# Patient Record
Sex: Female | Born: 1960 | Race: White | Hispanic: No | Marital: Married | Smoking: Never smoker
Health system: Southern US, Community
[De-identification: ages and names within clinical notes are randomized; demographics above are authoritative.]

## PROBLEM LIST (undated history)

## (undated) DIAGNOSIS — F39 Unspecified mood [affective] disorder: Secondary | ICD-10-CM

## (undated) DIAGNOSIS — N200 Calculus of kidney: Secondary | ICD-10-CM

## (undated) DIAGNOSIS — Z86010 Personal history of colonic polyps: Secondary | ICD-10-CM

## (undated) DIAGNOSIS — K900 Celiac disease: Secondary | ICD-10-CM

## (undated) DIAGNOSIS — R87619 Unspecified abnormal cytological findings in specimens from cervix uteri: Secondary | ICD-10-CM

## (undated) DIAGNOSIS — T7840XA Allergy, unspecified, initial encounter: Secondary | ICD-10-CM

## (undated) DIAGNOSIS — D219 Benign neoplasm of connective and other soft tissue, unspecified: Secondary | ICD-10-CM

## (undated) DIAGNOSIS — R32 Unspecified urinary incontinence: Secondary | ICD-10-CM

## (undated) DIAGNOSIS — F329 Major depressive disorder, single episode, unspecified: Secondary | ICD-10-CM

## (undated) DIAGNOSIS — E785 Hyperlipidemia, unspecified: Secondary | ICD-10-CM

## (undated) DIAGNOSIS — F419 Anxiety disorder, unspecified: Secondary | ICD-10-CM

## (undated) DIAGNOSIS — N809 Endometriosis, unspecified: Secondary | ICD-10-CM

## (undated) DIAGNOSIS — M549 Dorsalgia, unspecified: Secondary | ICD-10-CM

## (undated) DIAGNOSIS — B159 Hepatitis A without hepatic coma: Secondary | ICD-10-CM

## (undated) DIAGNOSIS — F32A Depression, unspecified: Secondary | ICD-10-CM

## (undated) DIAGNOSIS — N979 Female infertility, unspecified: Secondary | ICD-10-CM

## (undated) DIAGNOSIS — G44009 Cluster headache syndrome, unspecified, not intractable: Secondary | ICD-10-CM

## (undated) HISTORY — PX: DILATION AND CURETTAGE OF UTERUS: SHX78

## (undated) HISTORY — DX: Hyperlipidemia, unspecified: E78.5

## (undated) HISTORY — DX: Calculus of kidney: N20.0

## (undated) HISTORY — DX: Female infertility, unspecified: N97.9

## (undated) HISTORY — PX: OTHER SURGICAL HISTORY: SHX169

## (undated) HISTORY — DX: Unspecified urinary incontinence: R32

## (undated) HISTORY — DX: Unspecified abnormal cytological findings in specimens from cervix uteri: R87.619

## (undated) HISTORY — DX: Cluster headache syndrome, unspecified, not intractable: G44.009

## (undated) HISTORY — PX: COLONOSCOPY: SHX174

## (undated) HISTORY — DX: Dorsalgia, unspecified: M54.9

## (undated) HISTORY — DX: Endometriosis, unspecified: N80.9

## (undated) HISTORY — DX: Anxiety disorder, unspecified: F41.9

## (undated) HISTORY — DX: Personal history of colonic polyps: Z86.010

## (undated) HISTORY — DX: Major depressive disorder, single episode, unspecified: F32.9

## (undated) HISTORY — DX: Allergy, unspecified, initial encounter: T78.40XA

## (undated) HISTORY — DX: Benign neoplasm of connective and other soft tissue, unspecified: D21.9

## (undated) HISTORY — DX: Depression, unspecified: F32.A

## (undated) HISTORY — PX: EXPLORATORY LAPAROTOMY: SUR591

## (undated) HISTORY — DX: Unspecified mood (affective) disorder: F39

## (undated) HISTORY — DX: Celiac disease: K90.0

## (undated) HISTORY — DX: Hepatitis a without hepatic coma: B15.9

---

## 2009-08-22 ENCOUNTER — Ambulatory Visit: Payer: Self-pay | Admitting: Internal Medicine

## 2009-11-22 ENCOUNTER — Ambulatory Visit: Payer: Self-pay | Admitting: Internal Medicine

## 2009-11-30 ENCOUNTER — Encounter: Admission: RE | Admit: 2009-11-30 | Discharge: 2009-11-30 | Payer: Self-pay | Admitting: Internal Medicine

## 2009-12-02 ENCOUNTER — Ambulatory Visit (HOSPITAL_BASED_OUTPATIENT_CLINIC_OR_DEPARTMENT_OTHER): Admission: RE | Admit: 2009-12-02 | Discharge: 2009-12-02 | Payer: Self-pay | Admitting: Obstetrics & Gynecology

## 2010-09-12 ENCOUNTER — Ambulatory Visit
Admission: RE | Admit: 2010-09-12 | Discharge: 2010-09-12 | Payer: Self-pay | Source: Home / Self Care | Attending: Internal Medicine | Admitting: Internal Medicine

## 2010-09-12 ENCOUNTER — Other Ambulatory Visit
Admission: RE | Admit: 2010-09-12 | Discharge: 2010-09-12 | Payer: Self-pay | Source: Home / Self Care | Admitting: Internal Medicine

## 2010-11-22 LAB — CBC
HCT: 38.1 % (ref 36.0–46.0)
Hemoglobin: 12.6 g/dL (ref 12.0–15.0)
Platelets: 295 10*3/uL (ref 150–400)
RBC: 4.27 MIL/uL (ref 3.87–5.11)
RDW: 13.6 % (ref 11.5–15.5)

## 2010-11-22 LAB — URINALYSIS, ROUTINE W REFLEX MICROSCOPIC
Bilirubin Urine: NEGATIVE
Hgb urine dipstick: NEGATIVE
Ketones, ur: NEGATIVE mg/dL
Protein, ur: NEGATIVE mg/dL
Specific Gravity, Urine: 1.013 (ref 1.005–1.030)
Urobilinogen, UA: 0.2 mg/dL (ref 0.0–1.0)

## 2010-11-22 LAB — BASIC METABOLIC PANEL
BUN: 10 mg/dL (ref 6–23)
Calcium: 9 mg/dL (ref 8.4–10.5)
Creatinine, Ser: 0.66 mg/dL (ref 0.4–1.2)
GFR calc Af Amer: >60 mL/min (ref >60)

## 2010-12-11 ENCOUNTER — Other Ambulatory Visit: Payer: Self-pay | Admitting: Internal Medicine

## 2010-12-12 ENCOUNTER — Ambulatory Visit (INDEPENDENT_AMBULATORY_CARE_PROVIDER_SITE_OTHER): Payer: BC Managed Care – PPO | Admitting: Internal Medicine

## 2010-12-12 DIAGNOSIS — E785 Hyperlipidemia, unspecified: Secondary | ICD-10-CM

## 2010-12-12 DIAGNOSIS — K900 Celiac disease: Secondary | ICD-10-CM

## 2010-12-12 DIAGNOSIS — G5600 Carpal tunnel syndrome, unspecified upper limb: Secondary | ICD-10-CM

## 2011-01-01 DIAGNOSIS — Z860101 Personal history of adenomatous and serrated colon polyps: Secondary | ICD-10-CM

## 2011-01-01 DIAGNOSIS — Z86010 Personal history of colonic polyps: Secondary | ICD-10-CM | POA: Insufficient documentation

## 2011-01-01 HISTORY — DX: Personal history of adenomatous and serrated colon polyps: Z86.0101

## 2011-01-01 HISTORY — DX: Personal history of colonic polyps: Z86.010

## 2011-01-08 ENCOUNTER — Other Ambulatory Visit: Payer: Self-pay | Admitting: Internal Medicine

## 2011-01-08 DIAGNOSIS — Z1231 Encounter for screening mammogram for malignant neoplasm of breast: Secondary | ICD-10-CM

## 2011-01-16 ENCOUNTER — Ambulatory Visit
Admission: RE | Admit: 2011-01-16 | Discharge: 2011-01-16 | Disposition: A | Discharge status: Home / Self Care | Payer: BC Managed Care – PPO | Source: Ambulatory Visit | Attending: Internal Medicine | Admitting: Internal Medicine

## 2011-01-16 DIAGNOSIS — Z1231 Encounter for screening mammogram for malignant neoplasm of breast: Secondary | ICD-10-CM

## 2011-02-12 ENCOUNTER — Encounter: Payer: Self-pay | Admitting: Internal Medicine

## 2011-02-12 ENCOUNTER — Ambulatory Visit (INDEPENDENT_AMBULATORY_CARE_PROVIDER_SITE_OTHER): Payer: BC Managed Care – PPO | Admitting: Internal Medicine

## 2011-02-12 VITALS — BP 142/90 | HR 72 | Temp 98.7°F | Ht 65.0 in | Wt 156.0 lb

## 2011-02-12 DIAGNOSIS — F39 Unspecified mood [affective] disorder: Secondary | ICD-10-CM

## 2011-02-12 DIAGNOSIS — K900 Celiac disease: Secondary | ICD-10-CM

## 2011-02-12 DIAGNOSIS — IMO0001 Reserved for inherently not codable concepts without codable children: Secondary | ICD-10-CM

## 2011-02-12 DIAGNOSIS — M5430 Sciatica, unspecified side: Secondary | ICD-10-CM

## 2011-02-12 DIAGNOSIS — E559 Vitamin D deficiency, unspecified: Secondary | ICD-10-CM

## 2011-02-12 DIAGNOSIS — E785 Hyperlipidemia, unspecified: Secondary | ICD-10-CM

## 2011-02-12 DIAGNOSIS — M7918 Myalgia, other site: Secondary | ICD-10-CM

## 2011-02-12 DIAGNOSIS — M549 Dorsalgia, unspecified: Secondary | ICD-10-CM

## 2011-02-12 DIAGNOSIS — J309 Allergic rhinitis, unspecified: Secondary | ICD-10-CM

## 2011-02-13 ENCOUNTER — Encounter: Payer: Self-pay | Admitting: Internal Medicine

## 2011-02-13 DIAGNOSIS — F39 Unspecified mood [affective] disorder: Secondary | ICD-10-CM | POA: Insufficient documentation

## 2011-02-13 DIAGNOSIS — E785 Hyperlipidemia, unspecified: Secondary | ICD-10-CM | POA: Insufficient documentation

## 2011-02-13 DIAGNOSIS — M549 Dorsalgia, unspecified: Secondary | ICD-10-CM | POA: Insufficient documentation

## 2011-02-13 DIAGNOSIS — J309 Allergic rhinitis, unspecified: Secondary | ICD-10-CM | POA: Insufficient documentation

## 2011-02-13 NOTE — Progress Notes (Signed)
  Subjective:    Patient ID: Sheryl Nguyen, female    DOB: 1961-07-23, 50 y.o.   MRN: [id number]  HPI patient struggling for several weeks pain in the buttock radiating down posterior leg. Says the pain doesn't feel like a strained muscle but has a quite" different "kind of feeling. No weakness in the lower extremities but tiresome to walk. Trouble sitting in court. She is an Pensions consultant. Also some shoulder stiffness and neck soreness.    Review of Systems     Objective:   Physical Exam straight leg raising negative bilaterally,  deep tendon reflexes 2+ and symmetrical in the knees 1+ and symmetrical in the ankles; muscle strength 5/5 in both lower extremities. Tender in right parascapular area and bilateral sternocleidomastoid area.        Assessment & Plan:  Musculoskeletal pain neck and shoulder  Sciatica  Plan Sterapred DS 10 mg 6 day Dosepak. Flexeril 10 mg #30 one half to one by mouth each bedtime with when necessary 1 year refill. Vicodin 5/500 #60 one by mouth every 6 hours when necessary pain. If not improved in a week consider physical therapy.

## 2011-02-13 NOTE — Patient Instructions (Addendum)
   Take meds as directed. Call if no better in 2 weeks.

## 2011-05-17 ENCOUNTER — Other Ambulatory Visit: Payer: Self-pay | Admitting: Internal Medicine

## 2011-06-25 ENCOUNTER — Other Ambulatory Visit: Payer: Self-pay | Admitting: Internal Medicine

## 2011-07-24 ENCOUNTER — Other Ambulatory Visit: Payer: Self-pay | Admitting: Internal Medicine

## 2011-09-06 ENCOUNTER — Other Ambulatory Visit: Payer: Self-pay | Admitting: Internal Medicine

## 2011-09-10 ENCOUNTER — Other Ambulatory Visit: Payer: BC Managed Care – PPO | Admitting: Internal Medicine

## 2011-09-11 ENCOUNTER — Encounter: Payer: BC Managed Care – PPO | Admitting: Internal Medicine

## 2011-09-21 ENCOUNTER — Other Ambulatory Visit: Payer: BC Managed Care – PPO | Admitting: Internal Medicine

## 2011-09-24 ENCOUNTER — Ambulatory Visit: Payer: BC Managed Care – PPO | Admitting: Internal Medicine

## 2011-10-08 ENCOUNTER — Other Ambulatory Visit: Payer: Self-pay | Admitting: Internal Medicine

## 2011-10-11 ENCOUNTER — Other Ambulatory Visit: Payer: Self-pay | Admitting: Internal Medicine

## 2011-10-11 ENCOUNTER — Other Ambulatory Visit: Payer: BC Managed Care – PPO | Admitting: Internal Medicine

## 2011-10-11 DIAGNOSIS — Z0000 Encounter for general adult medical examination without abnormal findings: Secondary | ICD-10-CM

## 2011-10-11 LAB — COMPREHENSIVE METABOLIC PANEL
Alkaline Phosphatase: 67 U/L (ref 39–117)
BUN: 15 mg/dL (ref 6–23)
Calcium: 9.3 mg/dL (ref 8.4–10.5)
Glucose, Bld: 78 mg/dL (ref 70–99)
Potassium: 4 mEq/L (ref 3.5–5.3)
Total Bilirubin: 0.7 mg/dL (ref 0.3–1.2)

## 2011-10-11 LAB — TSH: TSH: 1.287 u[IU]/mL (ref 0.350–4.500)

## 2011-10-11 LAB — CBC WITH DIFFERENTIAL/PLATELET
Basophils Absolute: 0 10*3/uL (ref 0.0–0.1)
Basophils Relative: 1 % (ref 0–1)
Eosinophils Relative: 4 % (ref 0–5)
Lymphs Abs: 2.3 10*3/uL (ref 0.7–4.0)
MCV: 90.9 fL (ref 78.0–100.0)
Monocytes Relative: 7 % (ref 3–12)
Neutrophils Relative %: 56 % (ref 43–77)
RBC: 4.49 MIL/uL (ref 3.87–5.11)
RDW: 13 % (ref 11.5–15.5)
WBC: 7.1 10*3/uL (ref 4.0–10.5)

## 2011-10-11 LAB — LIPID PANEL
HDL: 48 mg/dL (ref >39)
LDL Cholesterol: 83 mg/dL (ref 0–99)
Triglycerides: 125 mg/dL (ref <150)

## 2011-10-12 LAB — VITAMIN D 25 HYDROXY (VIT D DEFICIENCY, FRACTURES): Vit D, 25-Hydroxy: 35 ng/mL (ref 30–89)

## 2011-10-15 ENCOUNTER — Ambulatory Visit (INDEPENDENT_AMBULATORY_CARE_PROVIDER_SITE_OTHER): Payer: BC Managed Care – PPO | Admitting: Internal Medicine

## 2011-10-15 ENCOUNTER — Encounter: Payer: Self-pay | Admitting: Internal Medicine

## 2011-10-15 VITALS — BP 126/90 | HR 68 | Temp 98.5°F | Ht 62.0 in | Wt 157.0 lb

## 2011-10-15 DIAGNOSIS — Z0000 Encounter for general adult medical examination without abnormal findings: Secondary | ICD-10-CM

## 2011-10-15 LAB — POCT URINALYSIS DIPSTICK
Glucose, UA: NEGATIVE
Ketones, UA: NEGATIVE
Leukocytes, UA: NEGATIVE
Protein, UA: NEGATIVE
pH, UA: 5.5

## 2011-10-15 LAB — CK: Total CK: 76 U/L (ref 7–177)

## 2011-11-03 ENCOUNTER — Encounter: Payer: Self-pay | Admitting: Internal Medicine

## 2011-11-03 NOTE — Patient Instructions (Signed)
Continue current medications. Return in 6 months for fasting lipid panel liver functions and office visit.

## 2011-11-03 NOTE — Progress Notes (Signed)
Subjective:    Patient ID: Sheryl Nguyen, female    DOB: [birth year], 51 y.o.   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 2010. 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 She has a history of allergic rhinitis and some mood disorder and depression. She had kidney stone some time around 2008 but we do not have those records. History of hepatitis A in 1985. Laparoscopic surgery for endometriosis in 1997. Has never been pregnant. No known drug allergies. Never had screening flexible sigmoidoscopy.  Social history she is a native of [place]. She worked in [employer] in [place]. Does not smoke. One glass of wine daily. Has an adopted daughter from [place]. Daughter is under the care of Dr. Shane Crutch in [facility] and is on Abilify.  Family history history of kidney stones in her father. Father also had history of hypertension and an MI. Mother with history of "nervous breakdown", headaches, arthritis and allergies.  In September 2011 she  was   found to have positive tissue trans-glutaminase IgA antibodies consistent with celiac disease. Daily and antibodies were negative. Recommended patient follow a gluten-free diet. She also has a history of hyperlipidemia treated with Lipitor. History of back pain for which Flexeril has been prescribed on a when necessary basis.  She had an endoscopy at [facility] in [facility] August 2012. She had a tubular adenoma. They recommended one-year followup on colonoscopy. Notes indicate that colon view quality was inadequate.    Review of Systems  Constitutional: Positive for fatigue.  Eyes: Negative.   Respiratory: Negative.   Cardiovascular: Negative.   Gastrointestinal:       History of celiac disease  Genitourinary: Negative.   Musculoskeletal: Positive for back pain and arthralgias.    Neurological: Negative.   Hematological: Negative.   Psychiatric/Behavioral:       Mood disorder       Objective:   Physical Exam  Vitals reviewed. Constitutional: She is oriented to person, place, and time. She appears well-developed and well-nourished. No distress.  HENT:  Head: Normocephalic and atraumatic.  Right Ear: External ear normal.  Left Ear: External ear normal.  Mouth/Throat: Oropharynx is clear and moist. No oropharyngeal exudate.  Eyes: Pupils are equal, round, and reactive to light. Right eye exhibits no discharge. Left eye exhibits no discharge. No scleral icterus.  Neck: Normal range of motion. Neck supple. No JVD present. No thyromegaly present.  Cardiovascular: Normal rate, regular rhythm, normal heart sounds and intact distal pulses.   No murmur heard. Pulmonary/Chest: Effort normal and breath sounds normal.       Breasts normal   Abdominal: Soft. Bowel sounds are normal. She exhibits no mass. There is no tenderness. There is no rebound.  Genitourinary:       Deferred to Dr. Leda Quail. Patient had D&C March 2011 for menorrhagia  Musculoskeletal: Normal range of motion. She exhibits no edema and no tenderness.  Lymphadenopathy:    She has no cervical adenopathy.  Neurological: She is alert and oriented to person, place, and time. She has normal reflexes. No cranial nerve deficit.  Skin: Skin is warm and dry. She is not diaphoretic.  Psychiatric: Her behavior is normal.          Assessment & Plan:  Allergic rhinitis  Mood disorder  History of vitamin D deficiency  History of tubular adenoma on colonoscopy August 2012  Celiac disease  Hyperlipidemia  Back pain  Plan: Patient will continue with vitamin D supplement on a daily basis, Lipitor 10 mg daily, Flexeril prn, Celexa 40 mg one half tablet nightly. Continue gluten-free diet. Return in 6 months for fasting lipid panel liver functions and office visit.

## 2011-11-14 ENCOUNTER — Other Ambulatory Visit: Payer: Self-pay

## 2011-11-14 MED ORDER — ATORVASTATIN CALCIUM 10 MG PO TABS: 10.0000 mg | ORAL_TABLET | Freq: Every day | ORAL | Status: DC

## 2012-01-16 ENCOUNTER — Other Ambulatory Visit: Payer: Self-pay | Admitting: Internal Medicine

## 2012-01-16 DIAGNOSIS — Z1231 Encounter for screening mammogram for malignant neoplasm of breast: Secondary | ICD-10-CM

## 2012-01-30 ENCOUNTER — Ambulatory Visit
Admission: RE | Admit: 2012-01-30 | Discharge: 2012-01-30 | Disposition: A | Discharge status: Home / Self Care | Payer: BC Managed Care – PPO | Source: Ambulatory Visit | Attending: Internal Medicine | Admitting: Internal Medicine

## 2012-01-30 DIAGNOSIS — Z1231 Encounter for screening mammogram for malignant neoplasm of breast: Secondary | ICD-10-CM

## 2012-01-31 ENCOUNTER — Other Ambulatory Visit: Payer: Self-pay | Admitting: Internal Medicine

## 2012-01-31 DIAGNOSIS — N644 Mastodynia: Secondary | ICD-10-CM

## 2012-02-24 ENCOUNTER — Other Ambulatory Visit: Payer: Self-pay | Admitting: Internal Medicine

## 2012-02-29 ENCOUNTER — Ambulatory Visit
Admission: RE | Admit: 2012-02-29 | Discharge: 2012-02-29 | Disposition: A | Discharge status: Home / Self Care | Payer: BC Managed Care – PPO | Source: Ambulatory Visit | Attending: Internal Medicine | Admitting: Internal Medicine

## 2012-02-29 DIAGNOSIS — N644 Mastodynia: Secondary | ICD-10-CM

## 2012-06-03 ENCOUNTER — Other Ambulatory Visit: Payer: Self-pay

## 2012-06-03 MED ORDER — ATORVASTATIN CALCIUM 10 MG PO TABS: 10.0000 mg | ORAL_TABLET | Freq: Every day | ORAL | Status: DC

## 2012-06-09 ENCOUNTER — Other Ambulatory Visit: Payer: BC Managed Care – PPO | Admitting: Internal Medicine

## 2012-06-09 DIAGNOSIS — E785 Hyperlipidemia, unspecified: Secondary | ICD-10-CM

## 2012-06-09 DIAGNOSIS — Z79899 Other long term (current) drug therapy: Secondary | ICD-10-CM

## 2012-06-09 LAB — HEPATIC FUNCTION PANEL
ALT: 20 U/L (ref 0–35)
Alkaline Phosphatase: 66 U/L (ref 39–117)
Total Bilirubin: 0.7 mg/dL (ref 0.3–1.2)
Total Protein: 6.6 g/dL (ref 6.0–8.3)

## 2012-06-09 LAB — LIPID PANEL: LDL Cholesterol: 73 mg/dL (ref 0–99)

## 2012-06-13 ENCOUNTER — Ambulatory Visit (INDEPENDENT_AMBULATORY_CARE_PROVIDER_SITE_OTHER): Payer: BC Managed Care – PPO | Admitting: Internal Medicine

## 2012-06-13 ENCOUNTER — Encounter: Payer: Self-pay | Admitting: Internal Medicine

## 2012-06-13 VITALS — BP 128/88 | HR 68 | Temp 98.9°F | Wt 156.5 lb

## 2012-06-13 DIAGNOSIS — Z23 Encounter for immunization: Secondary | ICD-10-CM

## 2012-06-13 DIAGNOSIS — E785 Hyperlipidemia, unspecified: Secondary | ICD-10-CM

## 2012-06-13 DIAGNOSIS — J309 Allergic rhinitis, unspecified: Secondary | ICD-10-CM

## 2012-06-13 NOTE — Progress Notes (Signed)
  Subjective:    Patient ID: Sheryl Nguyen, female    DOB: 15-Mar-1961, 51 y.o.   MRN: [id number]  HPI 51 year old W female with hyperlipidemia and allergic rhinitis for 6 month recheck. Recent fasting lipid panel on Lipitor 10 mg daily shows elevated triglycerides from 6 months ago. Did eat out at a restaurant about 2 nights before having lipids drawn. Diet has not changed otherwise and eats pretty well on the whole. Doesn't get much exercise. Influenza vaccine given today.  Also history of allergic rhinitis. Stopped using steroid nasal spray a few months ago has been taking Sudafed over-the-counter. Diastolic blood pressure slightly elevated today. Wants to go back on steroid nasal spray but Nasonex has high co-pay and would like to try something generic.    Review of Systems     Objective:   Physical Exam Neck is supple without thyromegaly. Chest clear to auscultation. Cardiac exam regular rate and rhythm. Extremities without edema. Boggy nasal mucosa.       Assessment & Plan:  Allergic rhinitis  Hyperlipidemia  Plan: Influenza immunization given today. Prescription for Flonase nasal spray generic 2 sprays in each nostril daily with when necessary 1 year refill. Continue Lipitor 10 mg daily. 2 months she'll have fasting lipid panel, no liver functions without office visit to see if triglycerides remain elevated. Otherwise return in 6 months for physical examination.

## 2012-06-13 NOTE — Patient Instructions (Addendum)
Watch diet and exercise. Return in 2 months for lipid panel

## 2012-08-01 ENCOUNTER — Encounter: Payer: Self-pay | Admitting: Internal Medicine

## 2012-08-11 ENCOUNTER — Other Ambulatory Visit: Payer: BC Managed Care – PPO | Admitting: Internal Medicine

## 2012-08-11 DIAGNOSIS — E785 Hyperlipidemia, unspecified: Secondary | ICD-10-CM

## 2012-08-11 LAB — LIPID PANEL
HDL: 47 mg/dL (ref >39)
LDL Cholesterol: 84 mg/dL (ref 0–99)
Total CHOL/HDL Ratio: 3.4 Ratio
VLDL: 30 mg/dL (ref 0–40)

## 2012-08-15 ENCOUNTER — Other Ambulatory Visit: Payer: BC Managed Care – PPO | Admitting: Internal Medicine

## 2012-12-05 ENCOUNTER — Other Ambulatory Visit: Payer: BC Managed Care – PPO | Admitting: Internal Medicine

## 2012-12-05 DIAGNOSIS — E785 Hyperlipidemia, unspecified: Secondary | ICD-10-CM

## 2012-12-05 DIAGNOSIS — Z0000 Encounter for general adult medical examination without abnormal findings: Secondary | ICD-10-CM

## 2012-12-05 DIAGNOSIS — I10 Essential (primary) hypertension: Secondary | ICD-10-CM

## 2012-12-05 LAB — TSH: TSH: 1.745 u[IU]/mL (ref 0.350–4.500)

## 2012-12-05 LAB — LIPID PANEL
LDL Cholesterol: 114 mg/dL — ABNORMAL HIGH (ref 0–99)
Total CHOL/HDL Ratio: 3.6 Ratio
VLDL: 26 mg/dL (ref 0–40)

## 2012-12-05 LAB — COMPREHENSIVE METABOLIC PANEL
ALT: 23 U/L (ref 0–35)
AST: 19 U/L (ref 0–37)
Calcium: 9.4 mg/dL (ref 8.4–10.5)
Chloride: 104 mEq/L (ref 96–112)
Creat: 0.87 mg/dL (ref 0.50–1.10)
Total Bilirubin: 0.4 mg/dL (ref 0.3–1.2)

## 2012-12-05 LAB — CBC WITH DIFFERENTIAL/PLATELET
Basophils Absolute: 0.1 10*3/uL (ref 0.0–0.1)
Eosinophils Relative: 3 % (ref 0–5)
Lymphocytes Relative: 34 % (ref 12–46)
MCV: 90.5 fL (ref 78.0–100.0)
Platelets: 262 10*3/uL (ref 150–400)
RDW: 13.4 % (ref 11.5–15.5)
WBC: 8.3 10*3/uL (ref 4.0–10.5)

## 2012-12-06 LAB — VITAMIN D 25 HYDROXY (VIT D DEFICIENCY, FRACTURES): Vit D, 25-Hydroxy: 25 ng/mL — ABNORMAL LOW (ref 30–89)

## 2012-12-08 ENCOUNTER — Encounter: Payer: Self-pay | Admitting: Internal Medicine

## 2012-12-08 ENCOUNTER — Ambulatory Visit (INDEPENDENT_AMBULATORY_CARE_PROVIDER_SITE_OTHER): Payer: BC Managed Care – PPO | Admitting: Internal Medicine

## 2012-12-08 VITALS — BP 144/94 | HR 68 | Temp 98.7°F | Ht 62.0 in | Wt 158.0 lb

## 2012-12-08 DIAGNOSIS — M549 Dorsalgia, unspecified: Secondary | ICD-10-CM

## 2012-12-08 DIAGNOSIS — K900 Celiac disease: Secondary | ICD-10-CM

## 2012-12-08 DIAGNOSIS — Z23 Encounter for immunization: Secondary | ICD-10-CM

## 2012-12-08 DIAGNOSIS — E559 Vitamin D deficiency, unspecified: Secondary | ICD-10-CM

## 2012-12-08 DIAGNOSIS — E785 Hyperlipidemia, unspecified: Secondary | ICD-10-CM

## 2012-12-08 DIAGNOSIS — Z0000 Encounter for general adult medical examination without abnormal findings: Secondary | ICD-10-CM

## 2012-12-08 DIAGNOSIS — F39 Unspecified mood [affective] disorder: Secondary | ICD-10-CM

## 2012-12-17 ENCOUNTER — Ambulatory Visit (INDEPENDENT_AMBULATORY_CARE_PROVIDER_SITE_OTHER): Payer: BC Managed Care – PPO | Admitting: Internal Medicine

## 2012-12-17 ENCOUNTER — Other Ambulatory Visit: Payer: Self-pay | Admitting: Internal Medicine

## 2012-12-17 ENCOUNTER — Ambulatory Visit
Admission: RE | Admit: 2012-12-17 | Discharge: 2012-12-17 | Disposition: A | Discharge status: Home / Self Care | Payer: BC Managed Care – PPO | Source: Ambulatory Visit | Attending: Internal Medicine | Admitting: Internal Medicine

## 2012-12-17 ENCOUNTER — Encounter: Payer: Self-pay | Admitting: Internal Medicine

## 2012-12-17 VITALS — BP 142/88 | HR 72 | Temp 98.6°F

## 2012-12-17 DIAGNOSIS — J9801 Acute bronchospasm: Secondary | ICD-10-CM

## 2012-12-17 DIAGNOSIS — R062 Wheezing: Secondary | ICD-10-CM

## 2012-12-17 DIAGNOSIS — H6590 Unspecified nonsuppurative otitis media, unspecified ear: Secondary | ICD-10-CM

## 2012-12-17 DIAGNOSIS — J209 Acute bronchitis, unspecified: Secondary | ICD-10-CM

## 2012-12-17 MED ORDER — CEFTRIAXONE SODIUM 1 G IJ SOLR
1.0000 g | Freq: Once | INTRAMUSCULAR | Status: AC
  Administered 2012-12-17: 1 g via INTRAMUSCULAR

## 2012-12-17 MED ORDER — CEFTRIAXONE SODIUM 1 G IJ SOLR: 1.0000 g | Freq: Once | INTRAMUSCULAR | Status: DC

## 2012-12-17 NOTE — Patient Instructions (Addendum)
Take Levaquin 500 mg daily for 7 days. Use albuterol inhaler up to 4 times daily for wheezing. Take Tessalon perles as needed for cough. Call if not better next week or sooner if worse. Given IM injection of Rocephin today.

## 2012-12-17 NOTE — Progress Notes (Signed)
  Subjective:    Patient ID: Sheryl Nguyen, female    DOB: May 27, 1961, 52 y.o.   MRN: [id number]  HPI Pt developed cough and congestion over the weekend of April 12. She had a house guest who was ill with respiratory infection but pt thought she might have allergic rhinitis because of all the pollen present so she began to take OTC antihistamine and Mucinex DM. She has been coughing with some wheezing and gurgling in her chest. Has had postnasal drip and sputum production but does not know color of sputum because she swallows it. No significant SOB. No fever or shaking chills. Husband has similar illness. Has ear congestion. Has been using Flonase nasal spray without relief.    Review of Systems     Objective:   Physical Exam  HENT:  Head: Normocephalic and atraumatic.  Mouth/Throat: No oropharyngeal exudate.  Both TMs full  Not red. Left is fuller than right. Sounds nasally congested and has congested cough.    Eyes: Conjunctivae are normal. Right eye exhibits no discharge. Left eye exhibits no discharge.  Neck: Neck supple.  Pulmonary/Chest:  Inspiratory rhonchi that clears some with coughing. Wheezing and rhonchi LLL that does not clear with coughing.  Lymphadenopathy:    She has no cervical adenopathy.          Assessment & Plan:  Bronchitis Bronchospasm Plan: CXR. 1 gram IM Rocephin given in office. Levaquin 500 mg daily x 7 days, Tessalon perles 100 mg (60) 2 po tid prn cough with one refill, Albuterol inhaler 2 sprays po qid prn wheezing. Call if not better next week or sooner if worse.

## 2012-12-18 ENCOUNTER — Ambulatory Visit: Payer: Self-pay | Admitting: Internal Medicine

## 2013-01-23 ENCOUNTER — Other Ambulatory Visit: Payer: Self-pay

## 2013-01-23 DIAGNOSIS — Z1231 Encounter for screening mammogram for malignant neoplasm of breast: Secondary | ICD-10-CM

## 2013-03-02 ENCOUNTER — Ambulatory Visit
Admission: RE | Admit: 2013-03-02 | Discharge: 2013-03-02 | Disposition: A | Discharge status: Home / Self Care | Payer: BC Managed Care – PPO | Source: Ambulatory Visit

## 2013-03-02 DIAGNOSIS — Z1231 Encounter for screening mammogram for malignant neoplasm of breast: Secondary | ICD-10-CM

## 2013-04-14 ENCOUNTER — Other Ambulatory Visit: Payer: Self-pay | Admitting: Internal Medicine

## 2013-06-02 ENCOUNTER — Encounter: Payer: Self-pay | Admitting: Internal Medicine

## 2013-06-02 NOTE — Progress Notes (Signed)
Subjective:    Patient ID: Sheryl Nguyen, female    DOB: 1960/11/11, 52 y.o.   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  No known drug allergies  Social history: She moved here from [place] with her husband who is the Scientist, physiological of the art history department at [employer]. They moved here in 2010. She is a native of [place]. She worked in family [employer] [place]. Does not smoke. One glass of wine daily. Has adopted daughter from [place] who is under the care of Dr. Shane Crutch in [place].  Family history: History of kidney stones in her father. Father also had history of hypertension and MI. Mother with history of "nervous breakdown", headaches, arthritis, allergies.  Past medical history: She had a kidney stone sometime around 2008 but we do not have those records. History of hepatitis A in 1985. Laparoscopic surgery for endometriosis in 1997. No known drug allergies. Has never been pregnant. History of allergic rhinitis and mood disorder with depression.  In September 2011 she was found to have positive tissue transglutaminase IgA antibodies consistent with celiac disease. Recommended patient follow gluten-free diet. She has a history of hyperlipidemia treated with Lipitor. History of back pain for which Flexeril has been prescribed when necessary.  History of colonoscopy at [facility] in [facility] in August 2012. Records indicate she had a tubular adenoma and they recommended one-year followup. Notes indicate that colon view was really not very adequate.    Review of Systems  Constitutional: Negative.   Gastrointestinal:       History of sprue   Musculoskeletal: Positive for back pain.  Allergic/Immunologic: Positive for environmental allergies.  Psychiatric/Behavioral:       History of mood disorder with depression       Objective:   Physical Exam  Vitals reviewed. Constitutional: She appears well-developed and well-nourished. No distress.  HENT:  Head: Normocephalic and atraumatic.  Right Ear: External ear normal.  Left Ear: External ear normal.  Mouth/Throat: Oropharynx is clear and moist. No oropharyngeal exudate.  Eyes: Conjunctivae and EOM are normal. Pupils are equal, round, and reactive to light. Right eye exhibits no discharge. Left eye exhibits no discharge. No scleral icterus.  Neck: No JVD present. No thyromegaly present.  Cardiovascular: Normal rate, regular rhythm and normal heart sounds.   No murmur heard. Pulmonary/Chest: Effort normal and breath sounds normal. No respiratory distress. She has no wheezes. She has no rales. She exhibits no tenderness.  Breasts normal female  Abdominal: Soft. Bowel sounds are normal. She exhibits no distension and no mass. There is no tenderness. There is no rebound and no guarding.  Genitourinary:  Deferred to GYN  Neurological: She is alert. She has normal reflexes. No cranial nerve deficit. Coordination normal.  Skin: Skin is warm and dry. No rash noted. She is not diaphoretic.  Psychiatric: She has a normal mood and affect. Her behavior is normal. Judgment and thought content normal.          Assessment & Plan:  Allergic rhinitis  History of celiac disease-follow gluten-free diet  History of mood disorder  History of vitamin D deficiency  History of tubular adenoma on colonoscopy 2012  History of hyperlipidemia-currently not on statin therapy. Continue  diet exercise and return in 6 months.  History of back pain  Plan: She has mild elevation of LDL cholesterol off statin therapy. Continue diet exercise efforts. Continue to follow gluten-free diet. Consider  repeat colonoscopy with history of tubular adenoma and an adequate view of colon.  Take Flexeril as needed for back pain. Vitamin D level is low she needs 2000  I.U. units D3 daily.

## 2013-06-22 ENCOUNTER — Ambulatory Visit (INDEPENDENT_AMBULATORY_CARE_PROVIDER_SITE_OTHER): Payer: BC Managed Care – PPO | Admitting: Internal Medicine

## 2013-06-22 DIAGNOSIS — Z23 Encounter for immunization: Secondary | ICD-10-CM

## 2013-07-23 ENCOUNTER — Other Ambulatory Visit: Payer: Self-pay | Admitting: Internal Medicine

## 2013-11-20 ENCOUNTER — Telehealth: Payer: Self-pay

## 2013-11-20 NOTE — Telephone Encounter (Signed)
Needs fasting lipid panel.

## 2013-11-20 NOTE — Telephone Encounter (Signed)
She is very concerned with daughter margaret's total cholesterol reading. Thought that once she was off of a certain medication, it would have gone down. Wants to know what to do. States her diet isn't all that bad. Explained this was non-fasting.

## 2013-11-20 NOTE — Telephone Encounter (Signed)
Mom informed. Will call back to schedule.

## 2014-04-07 ENCOUNTER — Other Ambulatory Visit: Payer: Self-pay | Admitting: Internal Medicine

## 2014-04-08 ENCOUNTER — Other Ambulatory Visit: Payer: Self-pay | Admitting: Internal Medicine

## 2014-05-06 ENCOUNTER — Telehealth: Payer: Self-pay | Admitting: Obstetrics & Gynecology

## 2014-05-06 NOTE — Telephone Encounter (Signed)
Spoke with patient. Patient states that she would like to schedule an appointment to speak with Dr.Miller about HRT. Offered next Tuesday at 3pm but patient declines. Appointment scheduled for 10/2 at 3pm with Dr.Miller. Patient agreeable to date and time.  Routing to provider for final review. Patient agreeable to disposition. Will close encounter

## 2014-05-06 NOTE — Telephone Encounter (Signed)
Pt would like to speak with Dr Sabra Heck about hormone medication.

## 2014-05-17 ENCOUNTER — Telehealth: Payer: Self-pay | Admitting: Obstetrics & Gynecology

## 2014-05-17 NOTE — Telephone Encounter (Signed)
LMTCB re: need to reschedule consult appointment with Dr. Sabra Heck from 06/04/14 to another day.

## 2014-05-26 IMAGING — CR DG CHEST 2V
2 series · 2 of 2 positions shown · non-contrast
Comparison: None.

CLINICAL DATA: Cough and wheezing for 2- 3 weeks

CHEST - 2 VIEW

[view not recorded (1 of 2)]
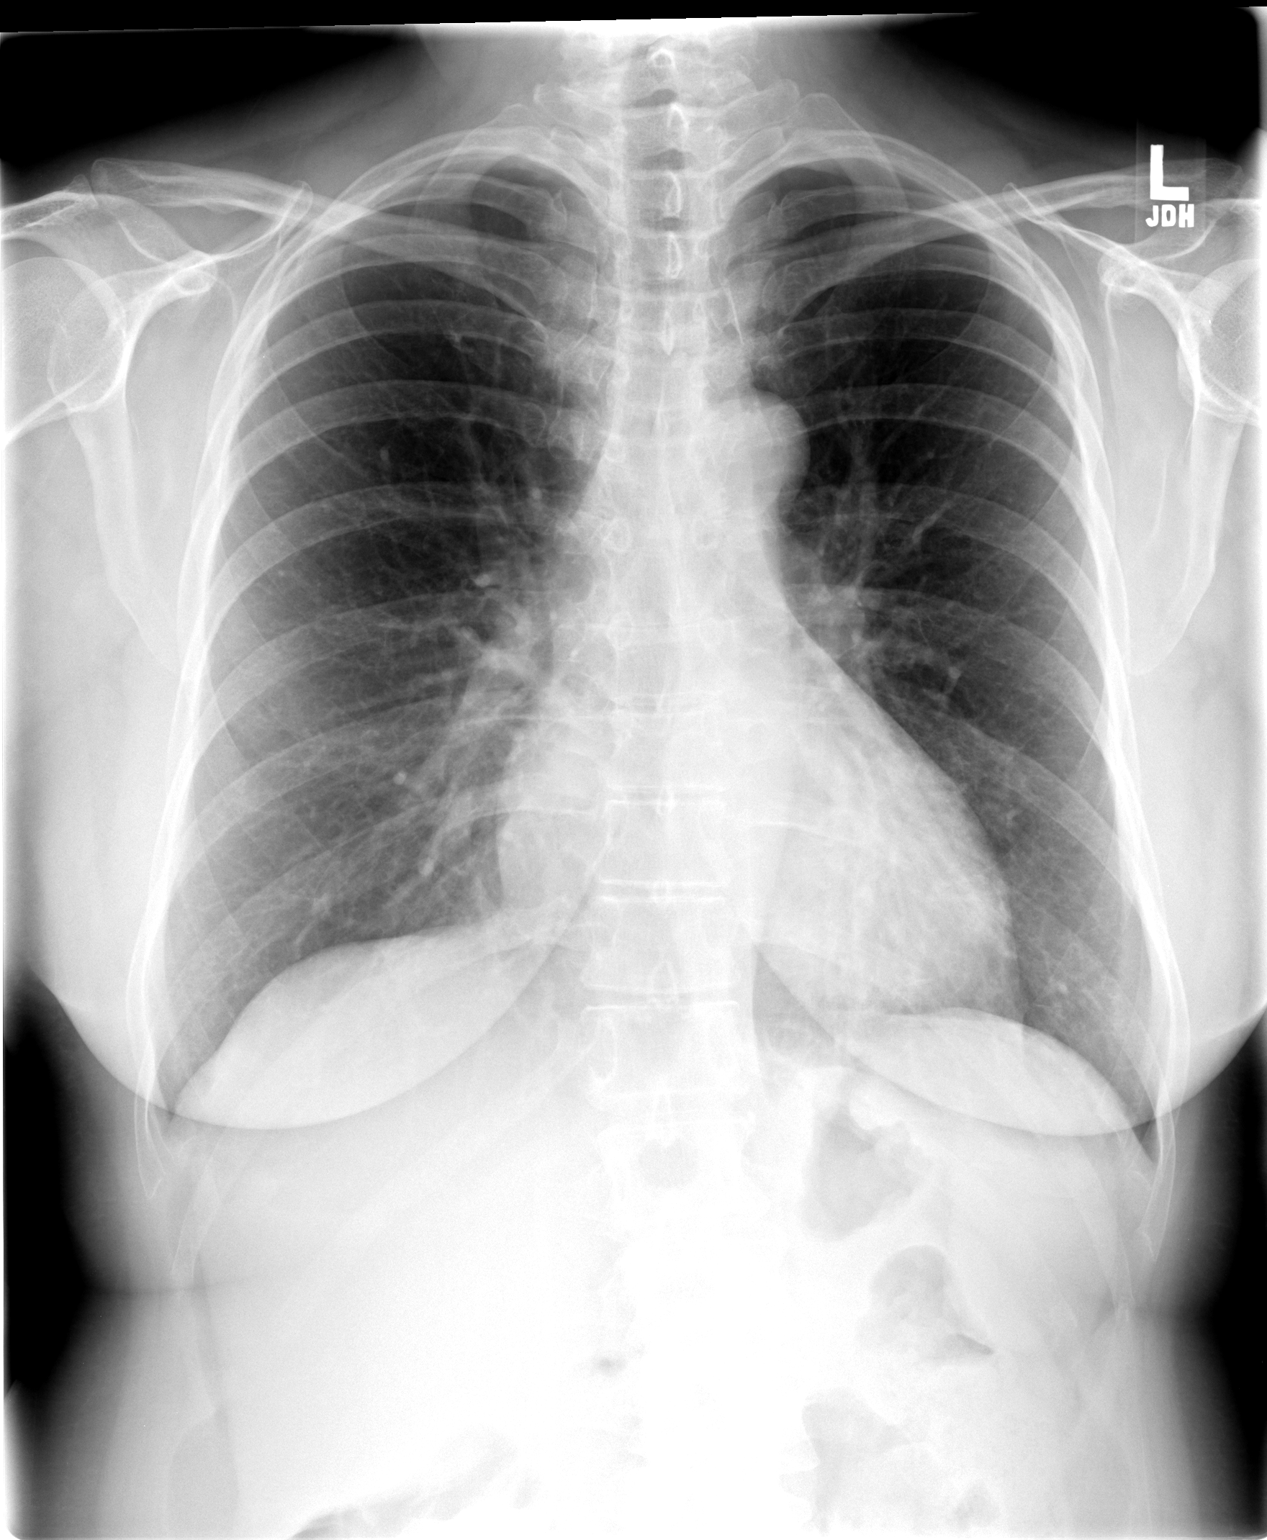

[view not recorded (2 of 2)]
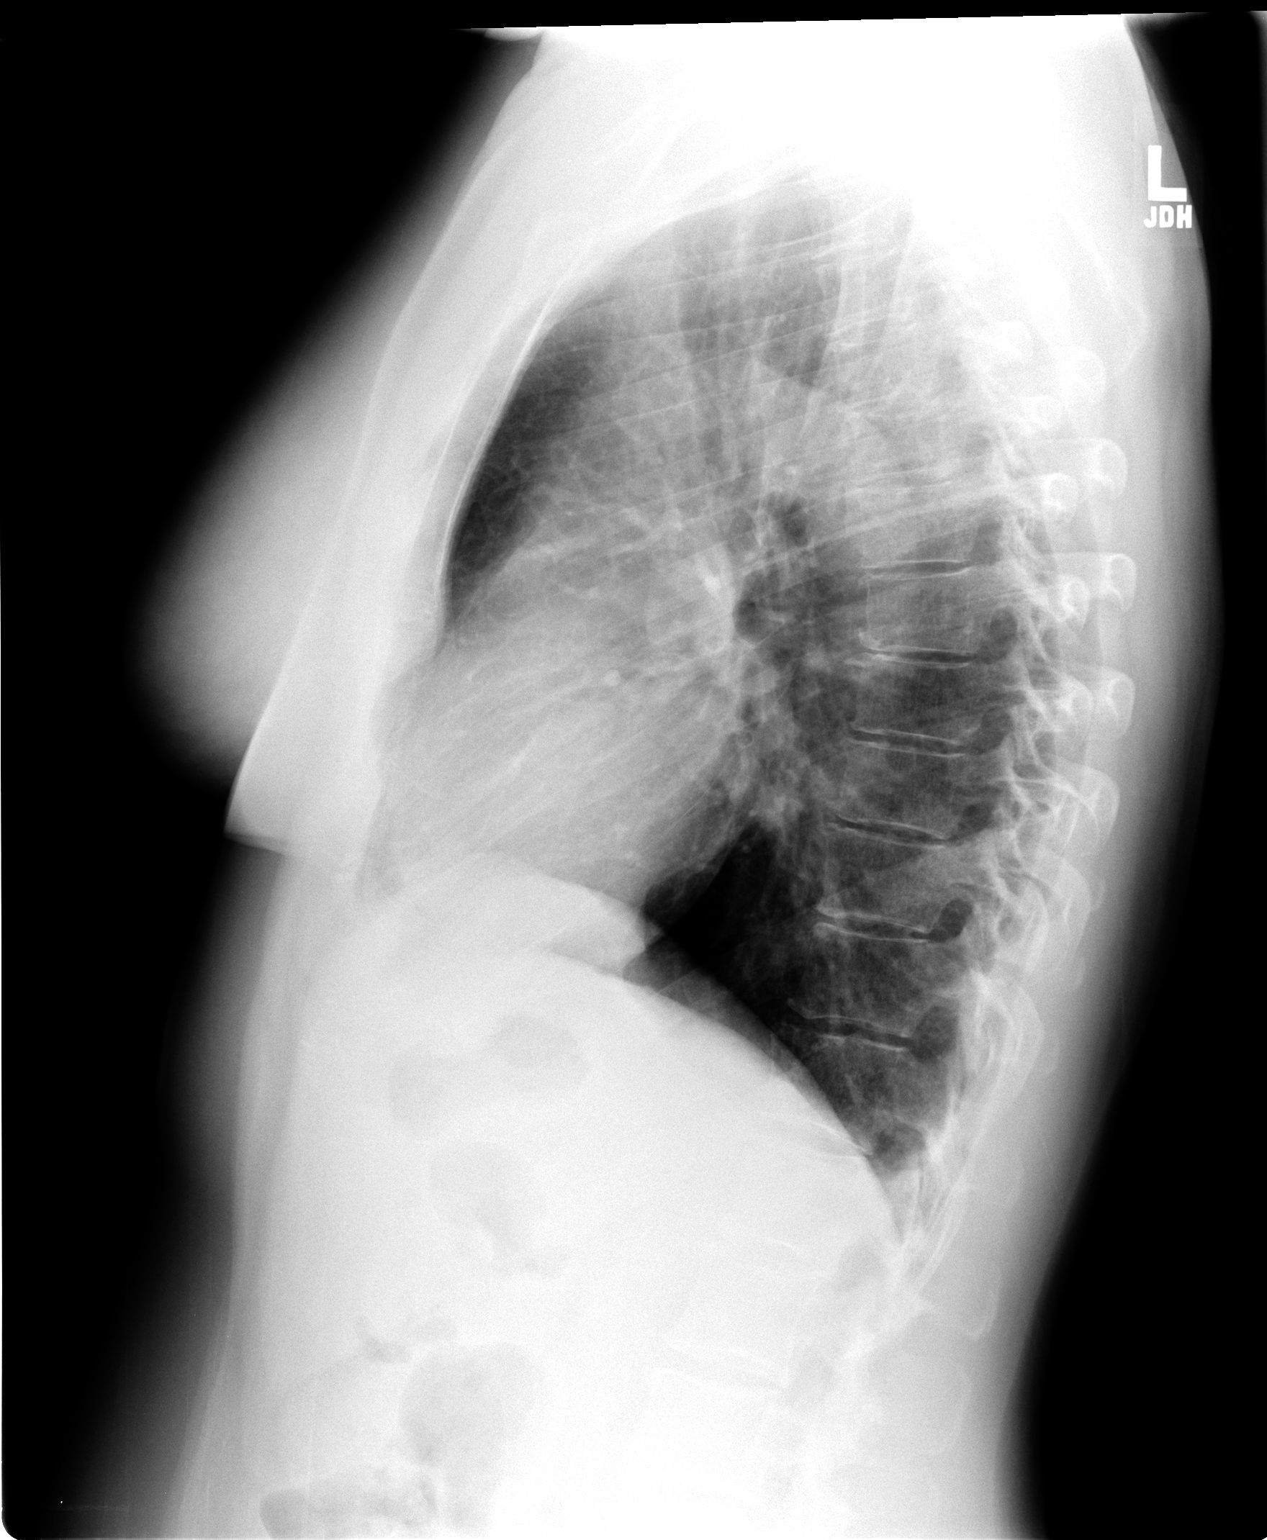

[2 of 2 positions shown; findings below may reference images not displayed]

FINDINGS: Heart size is normal.  Mediastinal shadows are normal.
There is mild central bronchial thickening.  No infiltrate,
collapse or effusion.  Bony structures are unremarkable except for
curvature of the spine.
IMPRESSION: Mild central bronchial thickening.  No consolidation or collapse.

## 2014-06-04 ENCOUNTER — Institutional Professional Consult (permissible substitution): Payer: BC Managed Care – PPO | Admitting: Obstetrics & Gynecology

## 2014-06-09 ENCOUNTER — Other Ambulatory Visit: Payer: Self-pay

## 2014-06-09 DIAGNOSIS — Z1239 Encounter for other screening for malignant neoplasm of breast: Secondary | ICD-10-CM

## 2014-06-22 ENCOUNTER — Other Ambulatory Visit: Payer: Self-pay

## 2014-06-22 DIAGNOSIS — Z1231 Encounter for screening mammogram for malignant neoplasm of breast: Secondary | ICD-10-CM

## 2014-06-24 ENCOUNTER — Ambulatory Visit
Admission: RE | Admit: 2014-06-24 | Discharge: 2014-06-24 | Disposition: A | Discharge status: Home / Self Care | Payer: BC Managed Care – PPO | Source: Ambulatory Visit

## 2014-06-24 ENCOUNTER — Encounter (INDEPENDENT_AMBULATORY_CARE_PROVIDER_SITE_OTHER): Payer: Self-pay

## 2014-06-24 DIAGNOSIS — Z1231 Encounter for screening mammogram for malignant neoplasm of breast: Secondary | ICD-10-CM

## 2014-07-16 ENCOUNTER — Institutional Professional Consult (permissible substitution): Payer: BC Managed Care – PPO | Admitting: Obstetrics & Gynecology

## 2014-08-09 ENCOUNTER — Encounter: Payer: Self-pay | Admitting: Certified Nurse Midwife

## 2014-08-09 ENCOUNTER — Ambulatory Visit (INDEPENDENT_AMBULATORY_CARE_PROVIDER_SITE_OTHER): Payer: BC Managed Care – PPO | Admitting: Certified Nurse Midwife

## 2014-08-09 VITALS — BP 122/82 | HR 70 | Resp 16 | Ht 61.75 in | Wt 164.0 lb

## 2014-08-09 DIAGNOSIS — N951 Menopausal and female climacteric states: Secondary | ICD-10-CM

## 2014-08-09 LAB — TSH: TSH: 1.289 u[IU]/mL (ref 0.350–4.500)

## 2014-08-09 NOTE — Progress Notes (Signed)
53 y.o. G0P0000 Married Caucasian Fe here to re-establish gyn care.  Patient has been seeing Tedra Senegal MD for aex and labs and pap smear. Patient is here for consult regarding HRT. Patient previous care here. Last visit was for Mirena IUD for menorrhagia in 2011. Patient is waking up 6-7 times at night due to nightmares, goes into series of hot flashes and night sweats and then will go back to sleep, but then reoccurs periodically. This started in this last year. Denies vaginal bleeding or vaginal dryness symptoms. Symptoms at night only. Occasional hot flash in daytime. Patient had not had any menopausal symptoms prior to this. Very fatigued due to interrupted sleep pattern. Has not discussed with Dr. Renold Genta, last aex 12/08/12 per epic. Per epic note pelvic deferred to GYN, but pap smear was done with results note..Patient has history of hyperlipidemia on medication, but stopped. " Not needed". Patient takes Celexa for mood changes, per Dr. Renold Genta in 6/.2012.Pateint had colonoscopy in 2012 with tubular adenoma and has not had one year follow up done. Patient is able to function daily without any problems. Denies any labs regarding menopause. "Not sure if I need HRT or sleep medication". History of Vit. D deficiency also. No other concerns today.  Patient's last menstrual period was 08/06/2014.          Sexually active: Yes.    The current method of family planning is IUD.    Exercising: No.  exercise Smoker:  no  Health Maintenance: Pap:  2014 negative MMG:  06-24-14 density category c, birads category 1:neg Colonoscopy:  2012 tubular adenoma one year follow up BMD:   2011 TDaP: 2014 Labs: pcp Self breast exam:   reports that she has never smoked. She has never used smokeless tobacco. She reports that she drinks about 4.2 oz of alcohol per week. She reports that she does not use illicit drugs.  Past Medical History  Diagnosis Date  . Allergy     allergic rhinitis  . Mood disorder   .  Celiac disease   . Hyperlipidemia   . Back pain   . Vitamin D deficiency   . Abnormal Pap smear of cervix     yrs ago  . Depression   . Cluster headache   . Endometriosis   . Fibroid   . Infertility, female   . Urinary incontinence   . Hepatitis A     Past Surgical History  Procedure Laterality Date  . Dilation and curettage of uterus    . Exploratory laparotomy    . Mirena insertion      Current Outpatient Prescriptions  Medication Sig Dispense Refill  . calcium-vitamin D (OSCAL WITH D) 250-125 MG-UNIT per tablet Take 1 tablet by mouth as needed.     . cetirizine (ZYRTEC) 10 MG tablet Take 10 mg by mouth daily.    . citalopram (CELEXA) 40 MG tablet TAKE 1 TABLET BY MOUTH DAILY 90 tablet 3  . fluticasone (FLONASE) 50 MCG/ACT nasal spray INSTILL 2 SPRAYS IN EACH NOSTRIL EVERY DAY 16 g 11  . levonorgestrel (MIRENA) 20 MCG/24HR IUD 1 each by Intrauterine route once.    . mometasone (NASONEX) 50 MCG/ACT nasal spray Place 2 sprays into the nose daily.       Current Facility-Administered Medications  Medication Dose Route Frequency Provider Last Rate Last Dose  . cefTRIAXone (ROCEPHIN) injection 1 g  1 g Intramuscular Once Elby Showers, MD        Family History  Problem Relation Age of Onset  . Skin cancer Mother   . Thyroid disease Mother   . Skin cancer Father   . Hypertension Father   . Stroke Father   . Heart attack Father   . Stroke Maternal Grandmother   . Diabetes Maternal Grandfather   . Hypertension Paternal Grandfather     ROS:  Pertinent items are noted in HPI.  Otherwise, a comprehensive ROS was negative.  Exam:   BP 122/82 mmHg  Pulse 70  Resp 16  Ht 5' 1.75" (1.568 m)  Wt 164 lb (74.39 kg)  BMI 30.26 kg/m2  LMP 08/06/2014 Height: 5' 1.75" (156.8 cm)  Ht Readings from Last 3 Encounters:  08/09/14 5' 1.75" (1.568 m)  12/08/12 5\' 2"  (1.575 m)  10/15/11 5\' 2"  (1.575 m)    General appearance: alert, cooperative and appears stated age.   A: ?  Menopausal symptoms Mirena IUD for menorrhagia 2011 History of Mood disorder/depression on Celexa with PCP management HRT desired if indicated   P: Discussed risks and benefits of HRT and that she would need ERT due to the fact she has a Mirena in no oral Progesterone needed until Mirena removed. Questions addressed at length. Discussed menopause etiology and expectations. Patient will need current exam with  here for initiation, will need current pap smear( last one 12/2012 negative per epic). Discussed with patient her risks with family history of MI, father, Her history hyperlipidemia not controlled which can contribute to CVD. Discussed that Celexa usually will help with some of her symptoms and unsure if it has helped due to onset during perimenopausal time. Recommend labs TSH, Prolactin, and FSH to determine status of menopause, due to amenorrhea since IUD insertion. Patient agreeable. Patient will need records of colonoscopy report. Other records are on Epic. Patient agreeable. Discussed this may also be related to another issue, such as thyroid or pituitary changes. Patient voiced understanding. Will advise when labs and record in.  Given information on menopause and other ways to help with hot flashes and night sweats. Patient appreciated time spent to understand her status.  Rv as above, prn  40 minutes spent with patient  in face to face counseling regarding menopausal related symptoms and options for management after further information available.Marland Kitchen

## 2014-08-09 NOTE — Patient Instructions (Signed)

## 2014-08-10 ENCOUNTER — Telehealth: Payer: Self-pay | Admitting: Certified Nurse Midwife

## 2014-08-10 LAB — FOLLICLE STIMULATING HORMONE: FSH: 21.5 m[IU]/mL

## 2014-08-10 LAB — PROLACTIN: Prolactin: 14.8 ng/mL

## 2014-08-10 NOTE — Telephone Encounter (Signed)
1. Patient calling to report for follow up to D. Hollice Espy, CNM that Dr. Sabra Heck inserted her Mirena 12/15/2009.  2. She also says  Dr. Cresenciano Lick. Baxley did not do any recent labs so she is probably due for blood work.  3. She says she has the irregular results for a colposcopy will be faxing them to our office.

## 2014-08-10 NOTE — Telephone Encounter (Signed)
Patient had office visit 12/7 with Regina Eck CNM. Routing to Regina Eck CNM as Juluis Rainier.

## 2014-08-10 NOTE — Telephone Encounter (Signed)
Reviewed and noted.

## 2014-08-11 NOTE — Progress Notes (Signed)
Reviewed personally.  M. Suzanne Salathiel Ferrara, MD.  

## 2014-08-17 ENCOUNTER — Telehealth: Payer: Self-pay

## 2014-08-17 NOTE — Progress Notes (Signed)
We need to have her request records, so they can be sent.

## 2014-08-17 NOTE — Telephone Encounter (Signed)
Patient notified see result note 

## 2014-08-17 NOTE — Telephone Encounter (Signed)
-----   Message from Regina Eck, CNM sent at 08/17/2014  8:19 AM EST ----- Notify patient that her TSH and prolactin is normal FSH is not indicating menopause, but glucose issues can cause this number to be low also. Has she had any glucose screening with her PCP. Did not see any of these labs on epic. Would recommend this being drawn. We can do here is she desires. Received her record from colonoscopy, this needs follow up. 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   She needed to repeat in 2013. She is not a candidate for HRT at this point, not indicated and she outstanding concern with colonoscopy.Does she want [place] to schedule an appointment for follow up? She may want to discuss with her PCP. Let me know her decisions.

## 2014-08-17 NOTE — Telephone Encounter (Signed)
lmtcb

## 2014-09-17 ENCOUNTER — Other Ambulatory Visit: Payer: BC Managed Care – PPO | Admitting: Internal Medicine

## 2014-09-17 DIAGNOSIS — Z1321 Encounter for screening for nutritional disorder: Secondary | ICD-10-CM

## 2014-09-17 DIAGNOSIS — Z0000 Encounter for general adult medical examination without abnormal findings: Secondary | ICD-10-CM

## 2014-09-17 DIAGNOSIS — Z1329 Encounter for screening for other suspected endocrine disorder: Secondary | ICD-10-CM

## 2014-09-17 DIAGNOSIS — Z13220 Encounter for screening for lipoid disorders: Secondary | ICD-10-CM

## 2014-09-17 DIAGNOSIS — Z130 Encounter for screening for diseases of the blood and blood-forming organs and certain disorders involving the immune mechanism: Secondary | ICD-10-CM

## 2014-09-17 LAB — CBC WITH DIFFERENTIAL/PLATELET
BASOS ABS: 0.1 10*3/uL (ref 0.0–0.1)
BASOS PCT: 1 % (ref 0–1)
EOS PCT: 3 % (ref 0–5)
Eosinophils Absolute: 0.2 10*3/uL (ref 0.0–0.7)
HEMATOCRIT: 44.6 % (ref 36.0–46.0)
Hemoglobin: 14.4 g/dL (ref 12.0–15.0)
LYMPHS ABS: 2.3 10*3/uL (ref 0.7–4.0)
Lymphocytes Relative: 36 % (ref 12–46)
MCH: 29.5 pg (ref 26.0–34.0)
MCHC: 32.3 g/dL (ref 30.0–36.0)
MCV: 91.4 fL (ref 78.0–100.0)
MONO ABS: 0.5 10*3/uL (ref 0.1–1.0)
MPV: 10.3 fL (ref 8.6–12.4)
Monocytes Relative: 8 % (ref 3–12)
NEUTROS ABS: 3.3 10*3/uL (ref 1.7–7.7)
NEUTROS PCT: 52 % (ref 43–77)
PLATELETS: 280 10*3/uL (ref 150–400)
RBC: 4.88 MIL/uL (ref 3.87–5.11)
RDW: 13.2 % (ref 11.5–15.5)
WBC: 6.4 10*3/uL (ref 4.0–10.5)

## 2014-09-17 LAB — TSH: TSH: 1.132 u[IU]/mL (ref 0.350–4.500)

## 2014-09-17 LAB — COMPREHENSIVE METABOLIC PANEL
ALBUMIN: 4.2 g/dL (ref 3.5–5.2)
ALK PHOS: 99 U/L (ref 39–117)
ALT: 22 U/L (ref 0–35)
AST: 22 U/L (ref 0–37)
BUN: 9 mg/dL (ref 6–23)
CHLORIDE: 101 meq/L (ref 96–112)
CO2: 25 mEq/L (ref 19–32)
CREATININE: 0.74 mg/dL (ref 0.50–1.10)
Calcium: 9.5 mg/dL (ref 8.4–10.5)
GLUCOSE: 77 mg/dL (ref 70–99)
Potassium: 4.2 mEq/L (ref 3.5–5.3)
Sodium: 136 mEq/L (ref 135–145)
TOTAL PROTEIN: 6.8 g/dL (ref 6.0–8.3)
Total Bilirubin: 0.8 mg/dL (ref 0.2–1.2)

## 2014-09-17 LAB — LIPID PANEL
CHOL/HDL RATIO: 4.9 ratio
CHOLESTEROL: 215 mg/dL — AB (ref 0–200)
HDL: 44 mg/dL (ref >39)
LDL Cholesterol: 132 mg/dL — ABNORMAL HIGH (ref 0–99)
Triglycerides: 197 mg/dL — ABNORMAL HIGH (ref <150)
VLDL: 39 mg/dL (ref 0–40)

## 2014-09-18 LAB — VITAMIN D 25 HYDROXY (VIT D DEFICIENCY, FRACTURES): Vit D, 25-Hydroxy: 16 ng/mL — ABNORMAL LOW (ref 30–100)

## 2014-09-20 ENCOUNTER — Other Ambulatory Visit: Payer: Self-pay | Admitting: Internal Medicine

## 2014-09-20 ENCOUNTER — Ambulatory Visit (INDEPENDENT_AMBULATORY_CARE_PROVIDER_SITE_OTHER): Payer: BC Managed Care – PPO | Admitting: Internal Medicine

## 2014-09-20 ENCOUNTER — Encounter: Payer: Self-pay | Admitting: Internal Medicine

## 2014-09-20 VITALS — BP 120/64 | HR 67 | Temp 98.0°F | Wt 159.0 lb

## 2014-09-20 DIAGNOSIS — M791 Myalgia: Secondary | ICD-10-CM | POA: Diagnosis not present

## 2014-09-20 DIAGNOSIS — J309 Allergic rhinitis, unspecified: Secondary | ICD-10-CM | POA: Diagnosis not present

## 2014-09-20 DIAGNOSIS — D126 Benign neoplasm of colon, unspecified: Secondary | ICD-10-CM | POA: Diagnosis not present

## 2014-09-20 DIAGNOSIS — E785 Hyperlipidemia, unspecified: Secondary | ICD-10-CM

## 2014-09-20 DIAGNOSIS — Z8719 Personal history of other diseases of the digestive system: Secondary | ICD-10-CM

## 2014-09-20 DIAGNOSIS — M7918 Myalgia, other site: Secondary | ICD-10-CM

## 2014-09-20 DIAGNOSIS — Z8639 Personal history of other endocrine, nutritional and metabolic disease: Secondary | ICD-10-CM

## 2014-09-20 DIAGNOSIS — F39 Unspecified mood [affective] disorder: Secondary | ICD-10-CM

## 2014-09-20 DIAGNOSIS — Z0000 Encounter for general adult medical examination without abnormal findings: Secondary | ICD-10-CM

## 2014-09-20 MED ORDER — AMITRIPTYLINE HCL 10 MG PO TABS: 10.0000 mg | ORAL_TABLET | Freq: Every day | ORAL | Status: DC

## 2014-09-20 MED ORDER — VITAMIN D (ERGOCALCIFEROL) 1.25 MG (50000 UNIT) PO CAPS: 50000.0000 [IU] | ORAL_CAPSULE | ORAL | Status: DC

## 2014-09-21 ENCOUNTER — Telehealth (INDEPENDENT_AMBULATORY_CARE_PROVIDER_SITE_OTHER): Payer: BC Managed Care – PPO | Admitting: *Deleted

## 2014-09-21 DIAGNOSIS — Z0000 Encounter for general adult medical examination without abnormal findings: Secondary | ICD-10-CM

## 2014-09-21 LAB — POCT URINALYSIS DIPSTICK
Bilirubin, UA: NEGATIVE
Blood, UA: NEGATIVE
Glucose, UA: NEGATIVE
Ketones, UA: NEGATIVE
LEUKOCYTES UA: NEGATIVE
Nitrite, UA: NEGATIVE
PROTEIN UA: NEGATIVE
SPEC GRAV UA: 1.01
UROBILINOGEN UA: NEGATIVE
pH, UA: 7

## 2014-09-21 NOTE — Telephone Encounter (Signed)
Referral for Colonoscopy entered for patient per Dr Renold Genta

## 2014-09-22 ENCOUNTER — Telehealth: Payer: Self-pay | Admitting: *Deleted

## 2014-09-22 DIAGNOSIS — Z1211 Encounter for screening for malignant neoplasm of colon: Secondary | ICD-10-CM

## 2014-09-22 NOTE — Telephone Encounter (Signed)
Patient referred for HM colonoscopy order entered

## 2014-09-30 ENCOUNTER — Encounter: Payer: Self-pay | Admitting: Internal Medicine

## 2014-10-25 ENCOUNTER — Encounter: Payer: Self-pay | Admitting: Internal Medicine

## 2014-12-01 NOTE — Patient Instructions (Addendum)
Have repeat colonoscopy here in [place]. Diet exercise and weight loss. Repeat fasting lipid panel in 6 months with office visit. Take Drisdol as prescribed in 2000 units vitamin D 3 daily after Drisdol weekly completed in 12 weeks.

## 2014-12-01 NOTE — Progress Notes (Signed)
Subjective:    Patient ID: Sheryl Nguyen, female    DOB: Mar 12, 1961, 54 y.o.   MRN: [id number]  HPI  54 year old white female with history of hyperlipidemia, allergic rhinitis, mood disorder, history of celiac disease, history of back pain and musculoskeletal pain, history of vitamin D deficiency in today for health maintenance exam and evaluation of medical issues.  No known drug allergies.  Social history: She moved here from [place] with her husband who is Magazine features editor of the Art department at [employer]. They moved here in 2010. She is a native of [place]. 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 She does not smoke. One glass of wine daily. Has  adopted daughter from [place] who is a Equities trader in high school. She is an Forensic psychologist.  Family history: History of kidney stones in father as well as hypertension and MI. Mother with history of "nervous breakdown", headaches, arthritis, allergies.  Past medical history: Patient had a kidney stone some time around 2008 but we do not have those records. History of hepatitis A in 1985. Laparoscopic surgery for endometriosis in 1997. No known drug allergies. Is never been pregnant. History of allergic rhinitis and mood disorder with depression.  In September 2011 she was found have positive tissue transglutaminase IgA antibodies consistent with celiac disease. Recommended patient follow gluten-free diet. History of hyperlipidemia treated with Lipitor. History of back pain for which Flexeril has been prescribed on a when necessary basis.  History of colonoscopy at [facility] in [facility] in August 2012. Records indicate she had a tubular adenoma and one year follow-up was recommended. Notes indicate that: View was not really very adequate.    Review of Systems  Constitutional: Negative.   Eyes: Negative.   Respiratory: Negative.   Cardiovascular: Negative.   Gastrointestinal:       History of celiac disease    Musculoskeletal: Positive for myalgias and back pain.  Allergic/Immunologic: Positive for environmental allergies.  Psychiatric/Behavioral:       History of mood disorder and depression       Objective:   Physical Exam  Constitutional: She is oriented to person, place, and time. She appears well-developed and well-nourished. No distress.  HENT:  Head: Normocephalic and atraumatic.  Right Ear: External ear normal.  Left Ear: External ear normal.  Mouth/Throat: Oropharynx is clear and moist. No oropharyngeal exudate.  Eyes: Conjunctivae and EOM are normal. Pupils are equal, round, and reactive to light. Right eye exhibits no discharge. Left eye exhibits no discharge. No scleral icterus.  Neck: Neck supple. No JVD present. No thyromegaly present.  Cardiovascular: Normal rate, regular rhythm, normal heart sounds and intact distal pulses.   No murmur heard. Pulmonary/Chest: Effort normal and breath sounds normal. No respiratory distress. She has no wheezes. She has no rales.  Abdominal: Soft. Bowel sounds are normal. She exhibits no distension and no mass. There is no tenderness. There is no rebound and no guarding.  Genitourinary:  Pap done 2014  Musculoskeletal: Normal range of motion. She exhibits no edema.  Lymphadenopathy:    She has no cervical adenopathy.  Neurological: She is alert and oriented to person, place, and time. She has normal reflexes. No cranial nerve deficit. Coordination normal.  Skin: Skin is warm and dry. No rash noted. She is not diaphoretic.  Psychiatric: She has a normal mood and affect. Her behavior is normal. Judgment and thought content normal.  Vitals reviewed.  Assessment & Plan:  Hyperlipidemia-has elevated triglycerides and LDL cholesterol as well as cholesterol. Need to reconsider statin therapy.  History of celiac disease-follows gluten-free diet  Mood disorder-stable  Allergic rhinitis-stable  Musculoskeletal pain  Vitamin D  deficiency have prescribed Drisdol 50,000 units weekly for 12 weeks then take 2000 units vitamin D 3 daily  History of tubular adenoma found at [facility] during colonoscopy in 2012. To have colonoscopy here in the near future

## 2014-12-24 ENCOUNTER — Ambulatory Visit (AMBULATORY_SURGERY_CENTER): Payer: Self-pay | Admitting: *Deleted

## 2014-12-24 VITALS — Ht 61.5 in | Wt 162.6 lb

## 2014-12-24 DIAGNOSIS — Z86010 Personal history of colonic polyps: Secondary | ICD-10-CM

## 2014-12-24 MED ORDER — MOVIPREP 100 G PO SOLR: 1.0000 | Freq: Once | ORAL | Status: DC

## 2014-12-24 NOTE — Progress Notes (Signed)
No egg or soy allergy No diet pills\ No home 02 No issues with past sedation emmi video  Pt has hx of T A polyps and last colon 2014 had inadequate prep so did 2 day prep for this colon

## 2015-01-03 ENCOUNTER — Encounter: Payer: Self-pay | Admitting: Internal Medicine

## 2015-01-03 ENCOUNTER — Ambulatory Visit (AMBULATORY_SURGERY_CENTER): Payer: BC Managed Care – PPO | Admitting: Internal Medicine

## 2015-01-03 VITALS — BP 150/85 | HR 57 | Temp 98.0°F | Resp 16 | Ht 61.75 in | Wt 159.0 lb

## 2015-01-03 DIAGNOSIS — Z86010 Personal history of colonic polyps: Secondary | ICD-10-CM

## 2015-01-03 MED ORDER — SODIUM CHLORIDE 0.9 % IV SOLN: 500.0000 mL | INTRAVENOUS | Status: DC

## 2015-01-03 NOTE — Op Note (Addendum)
[place]  [address] [place], [address]   COLONOSCOPY PROCEDURE REPORT  PATIENT: Sheryl, Nguyen  MR#: [id number] BIRTHDATE: 04/05/1961 , 67  yrs. old GENDER: female ENDOSCOPIST: Gatha Mayer, MD, [facility] PROCEDURE DATE:  01/03/2015 PROCEDURE:   Colonoscopy, surveillance First Screening Colonoscopy - Avg.  risk and is 50 yrs.  old or older - No.  Prior Negative Screening - Now for repeat screening. N/A  History of Adenoma - Now for follow-up colonoscopy & has been > or = to 3 yrs.  Yes hx of adenoma.  Has been 3 or more years since last colonoscopy. ASA CLASS:   Class II INDICATIONS:Surveillance due to prior colonic neoplasia and PH Colon Adenoma. MEDICATIONS: Propofol 300 mg IV, Monitored anesthesia care, and Lidocaine 40 mg IV  DESCRIPTION OF PROCEDURE:   After the risks benefits and alternatives of the procedure were thoroughly explained, informed consent was obtained.  The digital rectal exam revealed no abnormalities of the rectum.   The LB [id number]  endoscope was introduced through the anus and advanced to the cecum, which was identified by both the appendix and ileocecal valve. No adverse events experienced.   The quality of the prep was excellent. (MoviPrep was used) (MiraLax was used)  The instrument was then slowly withdrawn as the colon was fully examined.      COLON FINDINGS: A normal appearing cecum, ileocecal valve, and appendiceal orifice were identified.  The ascending, transverse, descending, sigmoid colon, and rectum appeared unremarkable. Retroflexed views revealed no abnormalities. The time to cecum = 2.9 Withdrawal time = 10.2   The scope was withdrawn and the procedure completed. COMPLICATIONS: There were no immediate complications.  ENDOSCOPIC IMPRESSION: Normal colonoscopy  RECOMMENDATIONS: Repeat Colonoscopy in 10 years. 2026 - hx 7 mm adenoma 2012 w/ innadequate preps 2012 and 2014 See me in office to review  celiac disease - she will call for appt. eSigned:  Gatha Mayer, MD, [facility] 01/03/2015 9:17 AM Revised: 01/03/2015 9:17 AM  cc: Emeline General, MD and The Patient

## 2015-01-03 NOTE — Patient Instructions (Addendum)
Today's colonoscopy was normal with an excellent prep.  Your next routine colonoscopy should be in 10 years 2026  Please call to make an appointment to review celiac disease  I appreciate the opportunity to care for you. Gatha Mayer, MD, FACG  YOU HAD AN ENDOSCOPIC PROCEDURE TODAY AT [facility]:   Refer to the procedure report that was given to you for any specific questions about what was found during the examination.  If the procedure report does not answer your questions, please call your gastroenterologist to clarify.  If you requested that your care partner not be given the details of your procedure findings, then the procedure report has been included in a sealed envelope for you to review at your convenience later.  YOU SHOULD EXPECT: Some feelings of bloating in the abdomen. Passage of more gas than usual.  Walking can help get rid of the air that was put into your GI tract during the procedure and reduce the bloating. If you had a lower endoscopy (such as a colonoscopy or flexible sigmoidoscopy) you may notice spotting of blood in your stool or on the toilet paper. If you underwent a bowel prep for your procedure, you may not have a normal bowel movement for a few days.  Please Note:  You might notice some irritation and congestion in your nose or some drainage.  This is from the oxygen used during your procedure.  There is no need for concern and it should clear up in a day or so.  SYMPTOMS TO REPORT IMMEDIATELY:   Following lower endoscopy (colonoscopy or flexible sigmoidoscopy):  Excessive amounts of blood in the stool  Significant tenderness or worsening of abdominal pains  Swelling of the abdomen that is new, acute  Fever of 100F or higher  For urgent or emergent issues, a gastroenterologist can be reached at any hour by calling [phone].   DIET: Your first meal following the procedure should be a small meal and then it is ok to progress  to your normal diet. Heavy or fried foods are harder to digest and may make you feel nauseous or bloated.  Likewise, meals heavy in dairy and vegetables can increase bloating.  Drink plenty of fluids but you should avoid alcoholic beverages for 24 hours.  ACTIVITY:  You should plan to take it easy for the rest of today and you should NOT DRIVE or use heavy machinery until tomorrow (because of the sedation medicines used during the test).    FOLLOW UP: Our staff will call the number listed on your records the next business day following your procedure to check on you and address any questions or concerns that you may have regarding the information given to you following your procedure. If we do not reach you, we will leave a message.  However, if you are feeling well and you are not experiencing any problems, there is no need to return our call.  We will assume that you have returned to your regular daily activities without incident.  If any biopsies were taken you will be contacted by phone or by letter within the next 1-3 weeks.  Please call us at [phone] if you have not heard about the biopsies in 3 weeks.    SIGNATURES/CONFIDENTIALITY: You and/or your care partner have signed paperwork which will be entered into your electronic medical record.  These signatures attest to the fact that that the information above on your After Visit Summary has been  reviewed and is understood.  Full responsibility of the confidentiality of this discharge information lies with you and/or your care-partner.  Discharge instructions given to patient and/or care partner.

## 2015-01-03 NOTE — Progress Notes (Signed)
Stable to RR 

## 2015-01-04 ENCOUNTER — Telehealth: Payer: Self-pay

## 2015-01-04 NOTE — Telephone Encounter (Signed)
Left a message at [phone] for the pt to call us back if any questions or concerns. maw

## 2015-01-17 ENCOUNTER — Telehealth: Payer: Self-pay | Admitting: Certified Nurse Midwife

## 2015-01-17 ENCOUNTER — Encounter: Payer: Self-pay | Admitting: Internal Medicine

## 2015-01-17 DIAGNOSIS — Z30432 Encounter for removal of intrauterine contraceptive device: Secondary | ICD-10-CM

## 2015-01-17 NOTE — Telephone Encounter (Signed)
Patient last seen on 08/09/2014 for aex with Regina Eck CNM. Mirena IUD was inserted in 2011. Patient had labs drawn at last OV as seen below. Patient calling in regards to IUD removal and possible reinsertion. Routing to Regina Eck CNM for review and advise.  Notes Recorded by Regina Eck, CNM on 08/17/2014 at 8:19 AM Notify patient that her TSH and prolactin is normal FSH is not indicating menopause, but glucose issues can cause this number to be low also. Has she had any glucose screening with her PCP. Did not see any of these labs on epic. Would recommend this being drawn. We can do here is she desires. Received her record from colonoscopy, this needs follow up. 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  She needed to repeat in 2013. She is not a candidate for HRT at this point, not indicated and she outstanding concern with colonoscopy.Does she want [place] to schedule an appointment for follow up? She may want to discuss with her PCP. Let me know her decisions.

## 2015-01-17 NOTE — Telephone Encounter (Signed)
Patient calling stating, "It's time to have my IUD removed." She has the same insurance on file. She has some questions for the nurse about whether she will have a new one placed or not.

## 2015-01-19 NOTE — Telephone Encounter (Signed)
I am waiting to consult with Dr. Sabra Heck on this patient.

## 2015-01-20 NOTE — Telephone Encounter (Signed)
Left message to call Kaitlyn at [phone]. 

## 2015-01-20 NOTE — Telephone Encounter (Signed)
Ok to schedule removal and then discuss if HRT needed.

## 2015-01-25 NOTE — Telephone Encounter (Signed)
Left message to call Arlind Klingerman at [phone]. 

## 2015-01-26 NOTE — Telephone Encounter (Signed)
Spoke with patient. Advised of message as seen below from [place]. Patient is agreeable and verbalizes understanding. Appointment scheduled for 6/1 at 2pm. Patient is agreeable to date and time. Order placed for precert of IUD removal.  Routing to provider for final review. Patient agreeable to disposition. Will close encounter.

## 2015-02-01 ENCOUNTER — Telehealth: Payer: Self-pay | Admitting: Certified Nurse Midwife

## 2015-02-01 NOTE — Telephone Encounter (Signed)
Rescheduled to 02/03/15 at 10:00 AM with Melvia Heaps, CNM.

## 2015-02-01 NOTE — Telephone Encounter (Signed)
Called patient and left a message cancelling her appointment due to the provider being out of the office the afternoon of 02/02/15. She will need to reschedule for: IUD removal and discussion of HRT vs possible reinsertion.

## 2015-02-01 NOTE — Telephone Encounter (Signed)
Routing to Regina Eck CNM for update and close encounter.

## 2015-02-02 ENCOUNTER — Ambulatory Visit: Payer: BC Managed Care – PPO | Admitting: Certified Nurse Midwife

## 2015-02-03 ENCOUNTER — Ambulatory Visit (INDEPENDENT_AMBULATORY_CARE_PROVIDER_SITE_OTHER): Payer: BC Managed Care – PPO | Admitting: Certified Nurse Midwife

## 2015-02-03 ENCOUNTER — Encounter: Payer: Self-pay | Admitting: Certified Nurse Midwife

## 2015-02-03 ENCOUNTER — Ambulatory Visit: Payer: BC Managed Care – PPO | Admitting: Certified Nurse Midwife

## 2015-02-03 VITALS — BP 118/80 | HR 70 | Ht 61.75 in | Wt 160.4 lb

## 2015-02-03 DIAGNOSIS — Z30432 Encounter for removal of intrauterine contraceptive device: Secondary | ICD-10-CM | POA: Diagnosis not present

## 2015-02-03 DIAGNOSIS — N951 Menopausal and female climacteric states: Secondary | ICD-10-CM

## 2015-02-03 NOTE — Progress Notes (Signed)
30 yrs Caucasian Married [id number] LMP spotting one month ago.     Present for Mirena IUD removal due to 5 year expiration. Denies pelvic pain or bleeding today. Plans for contraception are none. Patient is perimenopausal/? Menopausal and does not desire HRT. Some hot flashes and night sweats, but not bothersome. Vitamin D deficiency better and the menopause symptoms improved also. Sees Dr Renold Genta for aex, pelvic and breast exam. No other concerns today. Discussed with patient drawing [place] level to assess status if has vaginal bleeding in the next few months for management of if occurs. Agreeable.  LMP 5/16       HPI neg.  Exam: Normal WDWN female Abdomen: soft non-tender Groin:no inguinal nodes palpated    Pelvic exam:Pelvic exam: normal external genitalia, vulva, vagina, cervix, uterus and adnexa, VULVA: normal appearing vulva with no masses, tenderness or lesions, VAGINA: normal appearing vagina with normal color and discharge, no lesions, CERVIX: normal appearing cervix without discharge or lesions, IUD noted in cervix  UTERUS: uterus is normal size, shape, consistency and nontender, ADNEXA: normal adnexa in size, nontender and no masses.  Procedure: Speculum placed, cervix visualized.  IUD string visualized, grasp with ring forceps, with gentle traction IUD removed intact.  IUD shown to patient and discarded. Speculum removed.   Assessment:Mirena removal Pt tolerated procedure well. Normal pelvic exam Perimenopausal/menopausal  Plan: Patient will advise if has bleeding not related to removal of IUD. Discussed drawing [place] agreeable.  Lab [facility] [place]  Questions addressed  Return Visit: prn, continue AEX with Dr. Renold Genta

## 2015-02-04 ENCOUNTER — Telehealth: Payer: Self-pay

## 2015-02-04 LAB — FOLLICLE STIMULATING HORMONE: FSH: 131.8 m[IU]/mL — ABNORMAL HIGH

## 2015-02-04 NOTE — Progress Notes (Signed)
Reviewed personally.  M. Suzanne Terrian Ridlon, MD.  

## 2015-02-04 NOTE — Telephone Encounter (Signed)
lmtcb

## 2015-02-04 NOTE — Telephone Encounter (Signed)
Patient notified of results. See lab 

## 2015-02-04 NOTE — Telephone Encounter (Signed)
-----   Message from Regina Eck, CNM sent at 02/04/2015  7:59 AM EDT ----- Notify patient her [facility] shows menopause, needs to advise if vaginal bleeding other than what we discussed.

## 2015-03-17 ENCOUNTER — Encounter: Payer: Self-pay | Admitting: Internal Medicine

## 2015-03-17 ENCOUNTER — Other Ambulatory Visit (INDEPENDENT_AMBULATORY_CARE_PROVIDER_SITE_OTHER): Payer: BC Managed Care – PPO

## 2015-03-17 ENCOUNTER — Ambulatory Visit (INDEPENDENT_AMBULATORY_CARE_PROVIDER_SITE_OTHER): Payer: BC Managed Care – PPO | Admitting: Internal Medicine

## 2015-03-17 VITALS — BP 130/82 | HR 64 | Ht 61.5 in | Wt 160.1 lb

## 2015-03-17 DIAGNOSIS — E559 Vitamin D deficiency, unspecified: Secondary | ICD-10-CM

## 2015-03-17 DIAGNOSIS — Z91018 Allergy to other foods: Secondary | ICD-10-CM | POA: Diagnosis not present

## 2015-03-17 DIAGNOSIS — M2550 Pain in unspecified joint: Secondary | ICD-10-CM | POA: Insufficient documentation

## 2015-03-17 DIAGNOSIS — K900 Celiac disease: Secondary | ICD-10-CM | POA: Diagnosis not present

## 2015-03-17 LAB — IGA: IgA: 153 mg/dL (ref 68–378)

## 2015-03-17 LAB — VITAMIN D 25 HYDROXY (VIT D DEFICIENCY, FRACTURES): VITD: 32.35 ng/mL (ref 30.00–100.00)

## 2015-03-17 NOTE — Assessment & Plan Note (Signed)
Check labs 

## 2015-03-17 NOTE — Progress Notes (Signed)
   Subjective:  Cc: celiac disease  Patient ID: Sheryl Nguyen, female    DOB: August 13, 1961, 54 y.o.   MRN: [id number]  HPI This is a very nice lady with celiac disease diagnosed by serology several years ago. I don't have those records. I had seen her for a colonoscopy and discovered that celiac disease was on her diagnosis list. I'll further follow-up so she chose to,. She says that a number of years ago she went on a diet and try to lose weight with it but did not. Not sure what that diet was better conversation she had with a friend who was a physician said she might have celiac disease and she should be tested. Subsequently Dr. Renold Genta tested her and the patient says blood work showed she had celiac disease. She went gluten-free after that. She says she does appear good job of that but there are periods of time where she is not off of gluten. Most recently she traveled to [place] and was eating gluten-containing foods. In general her diarrhea is controlled pretty well but she does have a lot of aches and pains and fatigue. There has been some question as to whether or not she might have fibromyalgia. He has also been tested for food allergies by Dr. Ferdinand Lango at [place], and apparently tested positive for [employer]man's, avocado, oysters, bananas and chicken. She has a these was not sure that makes a difference with her. Medications, allergies, past medical history, past surgical history, family history and social history are reviewed and updated in the EMR.   Review of Systems Positive for allergies back pain fatigue and headaches night sweats insomnia and urinary incontinence.    Objective:   Physical Exam @BP  130/82 mmHg  Pulse 64  Ht 5' 1.5" (1.562 m)  Wt 160 lb 2 oz (72.632 kg)  BMI 29.77 kg/m2@  General:  Well-developed, well-nourished and in no acute distress Eyes:  anicteric. Lungs: Clear to auscultation bilaterally. Heart:  S1S2, no rubs, murmurs, gallops. Abdomen:  soft, non-tender, no  hepatosplenomegaly, hernia, or mass and BS+.  Extremities:   no edema, cyanosis or clubbing Psych:  appropriate mood and  Affect.   Data Reviewed: Low vitamin D 09/17/2014 normal TSH 09/17/2014 Osteopenia on DEXA scan 2011       Assessment & Plan:   1. Celiac disease   2. Vitamin D deficiency   3. Arthralgia   4. Multiple food allergies    We discussed how celiac disease can be diagnosis serology and/or duodenal biopsies. Some experts recommend confirmation with duodenal biopsies. Given that she has been ingesting gluten, he will be instructed to check celiac antibodies I will check the most sensitive and specific, tissue transglutaminase IgA antibody and IgA level to make sure she is not deficient. I will see if we can find the older studies.  She was told by her previous gastroenterologist the cancer was not a risk with celiac disease. I explained that there is an increased risk of lymphoma though fortunately is quite rare it is increased in patients with celiac disease.   I rechecked her vitamin D level and it is return normal she has supplemented with that. She should probably have a DEXA scan again. I will call with results and plans.   I appreciate the opportunity to care for this patient. CC: Elby Showers, MD

## 2015-03-17 NOTE — Assessment & Plan Note (Signed)
Recheck levels 

## 2015-03-17 NOTE — Patient Instructions (Signed)
  Your physician has requested that you go to the basement for the following lab work before leaving today: TTG, IGA, Vitamin D level   We will call you with results and plans.     I appreciate the opportunity to care for you. Silvano Rusk, MD, [facility]

## 2015-03-18 LAB — TISSUE TRANSGLUTAMINASE, IGA: TISSUE TRANSGLUTAMINASE AB, IGA: 1 U/mL (ref <4)

## 2015-03-20 NOTE — Progress Notes (Signed)
Quick Note:  Negative celiac test ______

## 2015-03-22 ENCOUNTER — Telehealth: Payer: Self-pay | Admitting: Internal Medicine

## 2015-03-22 ENCOUNTER — Other Ambulatory Visit: Payer: Self-pay

## 2015-03-22 DIAGNOSIS — K900 Celiac disease: Secondary | ICD-10-CM

## 2015-03-22 DIAGNOSIS — M8580 Other specified disorders of bone density and structure, unspecified site: Secondary | ICD-10-CM

## 2015-03-22 NOTE — Telephone Encounter (Signed)
Patient is scheduled for DEXA for 03/24/15 10:30.  She is notified that she should arrive at 10:15 and wear 2 piece clothes with no buttons New labs ordered for 6 months

## 2015-03-22 NOTE — Telephone Encounter (Signed)
OK  I made a mistake - please apologize - I confused her with another patient somehow  I should have said that TTG Ab level is low and shows that she has not been getting too much gluten   Her vit D level was ok  Given prior dx osteopenia it is appropriate to f/u DEXA scan - also since she has arthralgias - please order if she is willing  Let me know if she has any other ?  Would have her get TTG Ab again in 6 months

## 2015-03-22 NOTE — Telephone Encounter (Signed)
Patient was sent a message via My Chart  You do not have celiac disease.  I hope the Lotronex helps.   Gatha Mayer, MD, [facility]   I can't find in her chart that Lotronex was being prescribed or even discussed. She is calling about this message.  Please advise

## 2015-03-24 ENCOUNTER — Ambulatory Visit (INDEPENDENT_AMBULATORY_CARE_PROVIDER_SITE_OTHER)
Admission: RE | Admit: 2015-03-24 | Discharge: 2015-03-24 | Disposition: A | Discharge status: Home / Self Care | Payer: BC Managed Care – PPO | Source: Ambulatory Visit | Attending: Internal Medicine | Admitting: Internal Medicine

## 2015-03-24 DIAGNOSIS — M8580 Other specified disorders of bone density and structure, unspecified site: Secondary | ICD-10-CM | POA: Diagnosis not present

## 2015-03-24 DIAGNOSIS — K900 Celiac disease: Secondary | ICD-10-CM

## 2015-03-29 NOTE — Progress Notes (Signed)
Quick Note:  Bones show some thinning - osteopenia - but not osteoporosis  Rec: optimizing calcium (1200 mg/day) and vitamin D (800 IU/day) intake  Low TTG IgA Ab from before tells [place] she is doing a good job avoiding gluten so bone and joint aches probably not from celiac disease  I rec we see each other again in 1 year and have Ab's checked again   ______

## 2015-04-23 ENCOUNTER — Other Ambulatory Visit: Payer: Self-pay | Admitting: Internal Medicine

## 2015-07-22 ENCOUNTER — Telehealth: Payer: Self-pay | Admitting: Certified Nurse Midwife

## 2015-07-22 ENCOUNTER — Other Ambulatory Visit: Payer: Self-pay

## 2015-07-22 DIAGNOSIS — Z1231 Encounter for screening mammogram for malignant neoplasm of breast: Secondary | ICD-10-CM

## 2015-07-22 NOTE — Telephone Encounter (Signed)
Patient having postmenopausal bleeding. No chart

## 2015-07-22 NOTE — Telephone Encounter (Signed)
Spoke with patient. Patient states last Friday 11/11 she began to have "bleeding like my period." Patient was seen in office on 02/03/2015 for IUD removal. FSH was checked at that appointment and was 131.8. States "IT has pretty much stopped now. I usually bleed light with my cycle and this was the exact same." Advised patient will need to be seen in office for further evaluation. Patient is agreeable. Appointment scheduled for 11/21 at 3:30 pm with Dr.Miller. Agreeable to date and time.  Routing to provider for final review. Patient agreeable to disposition. Will close encounter.

## 2015-07-25 ENCOUNTER — Ambulatory Visit (INDEPENDENT_AMBULATORY_CARE_PROVIDER_SITE_OTHER): Payer: BC Managed Care – PPO | Admitting: Obstetrics & Gynecology

## 2015-07-25 VITALS — BP 148/82 | HR 72 | Resp 16 | Wt 161.0 lb

## 2015-07-25 DIAGNOSIS — Z124 Encounter for screening for malignant neoplasm of cervix: Secondary | ICD-10-CM

## 2015-07-25 DIAGNOSIS — N950 Postmenopausal bleeding: Secondary | ICD-10-CM

## 2015-07-25 NOTE — Progress Notes (Signed)
Subjective:     Patient ID: Sheryl Nguyen, female   DOB: 1960-09-23, 54 y.o.   MRN: [id number]  HPI 54 yo G0 MWF here for episode of PMP bleeding that started 07/15/15.  Bleeding started out juts like a period and she bled for three or four days.  Bleeding ended up being dark and old looking at the end.  She did have cramping.  Denies breast tenderness or acne before this bleeding began.  Pt has Mirena IUD removed 02/03/15 with [place] 131.  Reviewed with pt.  Advised bleeding would be abnormal in this setting.  Endometrial biopsy recommended.    Review of Systems  All other systems reviewed and are negative.      Objective:   Physical Exam  Constitutional: She is oriented to person, place, and time. She appears well-developed and well-nourished.  Abdominal: Soft. Bowel sounds are normal. She exhibits no distension. There is no tenderness. There is no rebound and no guarding.  Genitourinary: Uterus normal. There is no rash, tenderness or lesion on the right labia. There is no rash, tenderness or lesion on the left labia. Cervix exhibits no motion tenderness. Right adnexum displays no mass, no tenderness and no fullness. Left adnexum displays no mass, no tenderness and no fullness. There is bleeding in the vagina.  Blood at cervical os noted.  Lymphadenopathy:       Right: No inguinal adenopathy present.       Left: No inguinal adenopathy present.  Neurological: She is alert and oriented to person, place, and time.  Skin: Skin is warm and dry.  Psychiatric: She has a normal mood and affect.   Endometrial biopsy recommended.  Discussed with patient.  Verbal and written consent obtained.   Procedure:  Speculum placed.  Cervix visualized and cleansed with betadine prep.  A single toothed tenaculum was applied to the anterior lip of the cervix.  Endometrial pipelle was advanced through the cervix into the endometrial cavity without difficulty.  Pipelle passed to 6.5cm.  Suction applied and pipelle  removed with scant  tissue sample obtained.  Second pass performed.  Tenculum removed.  No bleeding noted.  Patient tolerated procedure well.     Assessment:     PMP bleeding with [facility] 131 6/16 H/o mirena IUD removal this summer as well     Plan:     Endometrial biopsy and pap pending.  Results will be called and recommendations will be made at that time as well.

## 2015-08-01 ENCOUNTER — Telehealth: Payer: Self-pay

## 2015-08-01 ENCOUNTER — Encounter: Payer: Self-pay | Admitting: Obstetrics & Gynecology

## 2015-08-01 LAB — IPS PAP TEST WITH HPV

## 2015-08-01 NOTE — Telephone Encounter (Signed)
-----   Message from Megan Salon, MD sent at 08/01/2015 10:18 AM EST ----- Please inform pt her pap was negative for abnormal cells and her HPV testing was negative.  Her endometrial biopsy was negative for abnormal cells as well.  I don't think she needs to do anything else at this time.  I'd like to see her again in three months.  Thanks.

## 2015-08-01 NOTE — Telephone Encounter (Signed)
Lmtcb//kn 

## 2015-08-01 NOTE — Telephone Encounter (Signed)
Patient returning call.

## 2015-08-01 NOTE — Telephone Encounter (Signed)
Patient notified of all results-see result note//kn 

## 2015-08-10 ENCOUNTER — Ambulatory Visit: Payer: BC Managed Care – PPO

## 2015-09-12 ENCOUNTER — Ambulatory Visit
Admission: RE | Admit: 2015-09-12 | Discharge: 2015-09-12 | Disposition: A | Discharge status: Home / Self Care | Payer: BC Managed Care – PPO | Source: Ambulatory Visit

## 2015-09-12 DIAGNOSIS — Z1231 Encounter for screening mammogram for malignant neoplasm of breast: Secondary | ICD-10-CM

## 2015-10-03 ENCOUNTER — Other Ambulatory Visit: Payer: BC Managed Care – PPO

## 2015-10-03 DIAGNOSIS — M8580 Other specified disorders of bone density and structure, unspecified site: Secondary | ICD-10-CM

## 2015-10-03 DIAGNOSIS — K900 Celiac disease: Secondary | ICD-10-CM

## 2015-10-04 LAB — TISSUE TRANSGLUTAMINASE, IGA: Tissue Transglutaminase Ab, IgA: 1 U/mL (ref <4)

## 2015-10-04 NOTE — Progress Notes (Signed)
Quick Note:  Negative celiac abs ______

## 2015-10-31 ENCOUNTER — Ambulatory Visit (INDEPENDENT_AMBULATORY_CARE_PROVIDER_SITE_OTHER): Payer: BC Managed Care – PPO | Admitting: Obstetrics & Gynecology

## 2015-10-31 ENCOUNTER — Encounter: Payer: Self-pay | Admitting: Obstetrics & Gynecology

## 2015-10-31 VITALS — BP 140/86 | HR 78 | Resp 16 | Ht 61.5 in | Wt 161.0 lb

## 2015-10-31 DIAGNOSIS — N950 Postmenopausal bleeding: Secondary | ICD-10-CM

## 2015-10-31 DIAGNOSIS — F39 Unspecified mood [affective] disorder: Secondary | ICD-10-CM | POA: Diagnosis not present

## 2015-10-31 NOTE — Progress Notes (Signed)
GYNECOLOGY  VISIT   HPI: 55 y.o. [id number] Married Caucasian female here for follow up from episode of PMP bleeding that occurred 07/25/15.  Pt had endometrial biopsy showing weakly proliferative endometrium.  Pap was obtained as well and was negative.  Follow-up recommended in case pt had additional bleeding.  Provera challenge was not initiated but we discussed this possibility, if she had bleeding again.  She has not.  Pt has some other quesitons for me today.  Recently seen by Dr. Carlean Purl.  H/O celiac disease.  Recent transglutaminate ab testing normal.  Pt on gluten free diet.  Wondering why she is not at risk for colon cancer due to "inflamamtion".  Answered question to best of my ability.  Pt is on Celexa and has been for years.  She is considering stopping it as she doesn't feel she needs it from mood standpoint, specifically from depression standpoint.  D/w pt tapering the medication vs transitioning to SNRI due to her body aches and fibromyalgia.  Cymbalta, specifically discussed.  Side effects and possible benefits reviewed.  Pt will consider.   GYNECOLOGIC HISTORY: No LMP recorded. Patient is not currently having periods (Reason: Other). Contraception: PMP  Patient Active Problem List   Diagnosis Date Noted  . Arthralgia 03/17/2015  . Celiac disease 02/13/2011  . Mood disorder ([place]) 02/13/2011  . Vitamin D deficiency 02/13/2011  . Other and unspecified hyperlipidemia 02/13/2011  . Allergic rhinitis 02/13/2011  . Back pain 02/13/2011  . Hx of adenomatous polyp of colon 01/01/2011    Past Medical History  Diagnosis Date  . Allergy     allergic rhinitis  . Mood disorder ([place])   . Celiac disease   . Hyperlipidemia   . Back pain   . Vitamin D deficiency   . Abnormal Pap smear of cervix     yrs ago  . Depression   . Cluster headache   . Endometriosis   . Fibroid   . Infertility, female   . Urinary incontinence   . Hepatitis A   . Hx of adenomatous polyp of colon  01/01/2011  . Anxiety   . Kidney stone     Past Surgical History  Procedure Laterality Date  . Dilation and curettage of uterus    . Exploratory laparotomy      endometriosis  . Mirena insertion      and removal  . Colonoscopy      2012 TA, 2014 all normal     MEDS:  Reviewed in EPIC and UTD  ALLERGIES: Gluten meal  Family History  Problem Relation Age of Onset  . Skin cancer Mother   . Thyroid disease Mother   . Skin cancer Father   . Hypertension Father   . Stroke Father   . Heart attack Father   . Stroke Maternal Grandmother   . Diabetes Maternal Grandfather   . Hypertension Paternal Grandfather   . Colon cancer Neg Hx   . Rectal cancer Neg Hx   . Stomach cancer Neg Hx   . Stroke Mother   . Aneurysm Mother     AAA  . Hypertension Mother     SH: non smoker  Review of Systems  Constitutional: Positive for malaise/fatigue.  Musculoskeletal: Positive for myalgias.  All other systems reviewed and are negative.   PHYSICAL EXAMINATION:    BP 140/86 mmHg  Pulse 78  Resp 16  Ht 5' 1.5" (1.562 m)  Wt 161 lb (73.029 kg)  BMI 29.93 kg/m2  Physical Exam  Constitutional: She is oriented to person, place, and time. She appears well-developed and well-nourished.  Neurological: She is alert and oriented to person, place, and time.  Skin: Skin is warm and dry.  Psychiatric: She has a normal mood and affect.   No other exam was performed  Assessment: H/O PMP bleeding, none since evaluation performed 07/25/15 showing weakly proliferative endometrium and normal pap 07/25/15 H/O depression that pt feels is gone, considering tapering off celexa Celiac disease H/o anxiety that pt also feels is improved  Plan: Pt knows to call if had additional bleeding.  For now, she will plan to return to PCP for care and can call with any problems.   Pt knows to call if she desires specific instructions regarding stopping her Celexa.   ~15 minutes spent with patient >50% of  time was in face to face discussion of above.

## 2015-12-01 ENCOUNTER — Other Ambulatory Visit: Payer: Self-pay | Admitting: Internal Medicine

## 2015-12-01 NOTE — Telephone Encounter (Signed)
Past due for PE Last seen Jan 2016. Please contact to schedule PE before refilling

## 2015-12-05 NOTE — Telephone Encounter (Signed)
Left message for return call.

## 2015-12-22 ENCOUNTER — Other Ambulatory Visit: Payer: BC Managed Care – PPO | Admitting: Internal Medicine

## 2015-12-22 DIAGNOSIS — Z130 Encounter for screening for diseases of the blood and blood-forming organs and certain disorders involving the immune mechanism: Secondary | ICD-10-CM

## 2015-12-22 DIAGNOSIS — Z0000 Encounter for general adult medical examination without abnormal findings: Secondary | ICD-10-CM

## 2015-12-22 DIAGNOSIS — Z1321 Encounter for screening for nutritional disorder: Secondary | ICD-10-CM

## 2015-12-22 DIAGNOSIS — Z13220 Encounter for screening for lipoid disorders: Secondary | ICD-10-CM

## 2015-12-22 DIAGNOSIS — Z1329 Encounter for screening for other suspected endocrine disorder: Secondary | ICD-10-CM

## 2015-12-22 LAB — COMPLETE METABOLIC PANEL WITH GFR
ALBUMIN: 3.9 g/dL (ref 3.6–5.1)
ALK PHOS: 88 U/L (ref 33–130)
ALT: 27 U/L (ref 6–29)
AST: 25 U/L (ref 10–35)
BILIRUBIN TOTAL: 0.6 mg/dL (ref 0.2–1.2)
BUN: 9 mg/dL (ref 7–25)
CO2: 26 mmol/L (ref 20–31)
Calcium: 9.5 mg/dL (ref 8.6–10.4)
Chloride: 101 mmol/L (ref 98–110)
Creat: 0.79 mg/dL (ref 0.50–1.05)
GFR, EST NON AFRICAN AMERICAN: 83 mL/min (ref >=60)
GFR, Est African American: >89 mL/min (ref >=60)
GLUCOSE: 78 mg/dL (ref 65–99)
Potassium: 4 mmol/L (ref 3.5–5.3)
SODIUM: 136 mmol/L (ref 135–146)
TOTAL PROTEIN: 6.4 g/dL (ref 6.1–8.1)

## 2015-12-22 LAB — CBC WITH DIFFERENTIAL/PLATELET
BASOS ABS: 56 {cells}/uL (ref 0–200)
Basophils Relative: 1 %
EOS ABS: 224 {cells}/uL (ref 15–500)
EOS PCT: 4 %
HCT: 41.1 % (ref 35.0–45.0)
HEMOGLOBIN: 13.5 g/dL (ref 11.7–15.5)
LYMPHS ABS: 2072 {cells}/uL (ref 850–3900)
Lymphocytes Relative: 37 %
MCH: 29.8 pg (ref 27.0–33.0)
MCHC: 32.8 g/dL (ref 32.0–36.0)
MCV: 90.7 fL (ref 80.0–100.0)
MONO ABS: 448 {cells}/uL (ref 200–950)
MPV: 10.6 fL (ref 7.5–12.5)
Monocytes Relative: 8 %
NEUTROS ABS: 2800 {cells}/uL (ref 1500–7800)
Neutrophils Relative %: 50 %
Platelets: 276 10*3/uL (ref 140–400)
RBC: 4.53 MIL/uL (ref 3.80–5.10)
RDW: 13.4 % (ref 11.0–15.0)
WBC: 5.6 10*3/uL (ref 3.8–10.8)

## 2015-12-22 LAB — LIPID PANEL
Cholesterol: 240 mg/dL — ABNORMAL HIGH (ref 125–200)
HDL: 40 mg/dL — ABNORMAL LOW (ref >=46)
LDL Cholesterol: 155 mg/dL — ABNORMAL HIGH (ref <130)
Total CHOL/HDL Ratio: 6 Ratio — ABNORMAL HIGH (ref <=5.0)
Triglycerides: 227 mg/dL — ABNORMAL HIGH (ref <150)
VLDL: 45 mg/dL — ABNORMAL HIGH (ref <30)

## 2015-12-22 LAB — TSH: TSH: 1.6 m[IU]/L

## 2015-12-23 LAB — VITAMIN D 25 HYDROXY (VIT D DEFICIENCY, FRACTURES): VIT D 25 HYDROXY: 35 ng/mL (ref 30–100)

## 2015-12-27 ENCOUNTER — Ambulatory Visit (INDEPENDENT_AMBULATORY_CARE_PROVIDER_SITE_OTHER): Payer: BC Managed Care – PPO | Admitting: Internal Medicine

## 2015-12-27 ENCOUNTER — Encounter: Payer: Self-pay | Admitting: Internal Medicine

## 2015-12-27 VITALS — BP 152/90 | HR 70 | Temp 97.4°F | Resp 20 | Ht 61.5 in | Wt 162.0 lb

## 2015-12-27 DIAGNOSIS — E669 Obesity, unspecified: Secondary | ICD-10-CM

## 2015-12-27 DIAGNOSIS — M791 Myalgia: Secondary | ICD-10-CM | POA: Diagnosis not present

## 2015-12-27 DIAGNOSIS — Z0000 Encounter for general adult medical examination without abnormal findings: Secondary | ICD-10-CM | POA: Diagnosis not present

## 2015-12-27 DIAGNOSIS — E559 Vitamin D deficiency, unspecified: Secondary | ICD-10-CM

## 2015-12-27 DIAGNOSIS — J309 Allergic rhinitis, unspecified: Secondary | ICD-10-CM

## 2015-12-27 DIAGNOSIS — Z8659 Personal history of other mental and behavioral disorders: Secondary | ICD-10-CM

## 2015-12-27 DIAGNOSIS — R030 Elevated blood-pressure reading, without diagnosis of hypertension: Secondary | ICD-10-CM

## 2015-12-27 DIAGNOSIS — E785 Hyperlipidemia, unspecified: Secondary | ICD-10-CM | POA: Diagnosis not present

## 2015-12-27 DIAGNOSIS — G4700 Insomnia, unspecified: Secondary | ICD-10-CM | POA: Diagnosis not present

## 2015-12-27 DIAGNOSIS — M7918 Myalgia, other site: Secondary | ICD-10-CM

## 2015-12-27 DIAGNOSIS — IMO0001 Reserved for inherently not codable concepts without codable children: Secondary | ICD-10-CM

## 2015-12-27 MED ORDER — AMITRIPTYLINE HCL 10 MG PO TABS: ORAL_TABLET | ORAL | Status: DC

## 2015-12-28 ENCOUNTER — Encounter: Payer: Self-pay | Admitting: Internal Medicine

## 2015-12-28 DIAGNOSIS — E669 Obesity, unspecified: Secondary | ICD-10-CM | POA: Insufficient documentation

## 2015-12-28 DIAGNOSIS — M7918 Myalgia, other site: Secondary | ICD-10-CM | POA: Insufficient documentation

## 2015-12-28 DIAGNOSIS — G4700 Insomnia, unspecified: Secondary | ICD-10-CM | POA: Insufficient documentation

## 2015-12-28 NOTE — Progress Notes (Signed)
Subjective:    Patient ID: Sheryl Nguyen, female    DOB: March 31, 1961, 55 y.o.   MRN: [id number]  HPI 55 year old Female in today for health maintenance exam and evaluation of medical issues. Patient has issues with sleeping and has had these issues for several years. At one point we tried amitriptyline 10 mg at bedtime but she didn't take it very long and took it kind of intermittently. She feels lethargic in the daytime and doesn't feel like exercising. Feels it's because she's not getting good sleep. She's been on Celexa for a long time. It helped with her mood and keeping her temper with her adopted daughter. Daughter is nail in college in [place].  No known drug allergies  She has a history of hyperlipidemia, allergic rhinitis, celiac disease, back pain and musculoskeletal pain. History of vitamin D deficiency.  Social history: She moved here from [place] with her husband in 2010. Husband. Merwyn Katos,  accepted job at [employer] as [employer] of the Art department. Her adopted daughter from [place] attends Armed forces logistics/support/administrative officer and is interested in the arts. Patient is a native of [place]. 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 She does not smoke. One glass of wine daily.  Family history: History of kidney stones in her father as well as hypertension and MI. Mother with history of "nervous breakdown ", headaches, arthritis, allergies. Says mother is developing dementia symptoms.  Past medical history: Patient had a kidney stone sometime around 2008 but we do not have those records. History of hepatitis A in 1985. Laparoscopic surgery for endometriosis in 1997. Has never been pregnant. History of mood disorder and depression treated with Celexa.  In September 2011, she was found to have positive tissue transglutaminase IgA antibodies consistent with celiac disease. Recommended patient follow gluten-free diet. History of hyperlipidemia treated with Lipitor. History of back pain  for which Flexeril has been prescribed on a when necessary basis. Currently not taking Lipitor.  She had colonoscopy at [facility] in [facility] in August 2012. Records indicate she had a tubular adenoma. She had repeat colonoscopy with Dr. Carlean Purl in 2016 that was normal with 10 year follow-up recommended. Dr. Carlean Purl also tested her for gluten sensitivity and did not find positive test. However we found significantly elevated tissue transglutaminase IgA antibodies in 2011.  She's had some issues with postmenopausal bleeding and endometrial biopsy by Dr. Sabra Heck reveal weakly positive endometrium. IUD was removed.      Review of Systems musculoskeletal pain on a daily basis., Generalized fatigue and lack of energy     Objective:   Physical Exam  Constitutional: She is oriented to person, place, and time. She appears well-developed and well-nourished. No distress.  HENT:  Head: Normocephalic and atraumatic.  Right Ear: External ear normal.  Left Ear: External ear normal.  Mouth/Throat: Oropharynx is clear and moist. No oropharyngeal exudate.  Eyes: Conjunctivae and EOM are normal. Pupils are equal, round, and reactive to light. Right eye exhibits no discharge. Left eye exhibits no discharge. No scleral icterus.  Neck: No JVD present. No thyromegaly present.  Cardiovascular: Normal rate, regular rhythm and normal heart sounds.   No murmur heard. Pulmonary/Chest: Effort normal and breath sounds normal. No respiratory distress. She has no wheezes.  Breasts normal female  Abdominal: Soft. Bowel sounds are normal. She exhibits no distension and no mass. There is no tenderness. There is no rebound and no guarding.  Genitourinary:  Deferred to  Dr. Edwinna Areola  Musculoskeletal: She exhibits no edema.  Neurological: She is alert and oriented to person, place, and time. She has normal reflexes. No cranial nerve deficit. Coordination normal.  Skin: Skin is warm and dry. No rash  noted. She is not diaphoretic.  Psychiatric: She has a normal mood and affect. Her behavior is normal. Judgment and thought content normal.  Vitals reviewed.         Assessment & Plan:  History of mood disorder and depression-patient wants to come off of Celexa. We going to reduce it to 20 mg daily and reevaluate in 4 weeks. We'll further consider reducing it from 20-10 mg at that time.  Elevated blood pressure-reevaluate in 4 weeks  Hyperlipidemia-needs to follow diet and exercise regimen. Used to be on Lipitor but has stopped taking it.Total cholesterol 240, triglycerides 227 and LDL cholesterol 185. Reevaluate in 6 months. TSH is normal.  Insomnia-she's going to try amitriptyline 10 mg daily to see if this will help with musculoskeletal pain and sleep.  Musculoskeletal pain-may have an element of fibromyalgia  History of vitamin D deficiency-level is 35. Continue over-the-counter vitamin D supplementation.  Allergic rhinitis  Plan: Reevaluate in 4 weeks. Decrease Celexa 20 mg daily. Amitriptyline 10 mg at bedtime.

## 2015-12-28 NOTE — Patient Instructions (Signed)
Decrease Celexa to 20 mg daily. Reevaluate Celexa management and elevated blood pressure in 4 weeks. Diet and exercise for hyperlipidemia. Take amitriptyline 10 mg at bedtime for insomnia and musculoskeletal pain

## 2015-12-29 LAB — POCT URINALYSIS DIPSTICK
BILIRUBIN UA: NEGATIVE
Glucose, UA: NEGATIVE
KETONES UA: NEGATIVE
Leukocytes, UA: NEGATIVE
NITRITE UA: NEGATIVE
PH UA: 7
PROTEIN UA: NEGATIVE
RBC UA: NEGATIVE
Spec Grav, UA: 1.025
Urobilinogen, UA: 0.2

## 2015-12-29 NOTE — Addendum Note (Signed)
Addended by: Beryle Quant on: 12/29/2015 02:41 PM   Modules accepted: Orders

## 2016-01-26 ENCOUNTER — Ambulatory Visit (INDEPENDENT_AMBULATORY_CARE_PROVIDER_SITE_OTHER): Payer: BC Managed Care – PPO | Admitting: Internal Medicine

## 2016-01-26 ENCOUNTER — Encounter: Payer: Self-pay | Admitting: Internal Medicine

## 2016-01-26 VITALS — BP 158/100 | HR 72 | Temp 98.0°F | Resp 18 | Ht 62.0 in | Wt 159.0 lb

## 2016-01-26 DIAGNOSIS — R519 Headache, unspecified: Secondary | ICD-10-CM

## 2016-01-26 DIAGNOSIS — G4700 Insomnia, unspecified: Secondary | ICD-10-CM | POA: Diagnosis not present

## 2016-01-26 DIAGNOSIS — IMO0001 Reserved for inherently not codable concepts without codable children: Secondary | ICD-10-CM

## 2016-01-26 DIAGNOSIS — R51 Headache: Secondary | ICD-10-CM | POA: Diagnosis not present

## 2016-01-26 DIAGNOSIS — R030 Elevated blood-pressure reading, without diagnosis of hypertension: Secondary | ICD-10-CM

## 2016-01-26 MED ORDER — CITALOPRAM HYDROBROMIDE 10 MG PO TABS: 10.0000 mg | ORAL_TABLET | Freq: Every day | ORAL | Status: DC

## 2016-01-26 MED ORDER — LOSARTAN POTASSIUM 50 MG PO TABS: 50.0000 mg | ORAL_TABLET | Freq: Every day | ORAL | Status: DC

## 2016-01-26 NOTE — Progress Notes (Signed)
   Subjective:    Patient ID: Sheryl Nguyen, female    DOB: 1961/01/25, 55 y.o.   MRN: [id number]  HPI Her blood pressure remains elevated at 158/100. She had onset of headache yesterday. She tried over-the-counter analgesics without relief. Subsequently tried some Afrin nasal spray and symptoms improved. Had headache around the bridge of her nose but later it progressed into the occipital area. With regard to insomnia, she sleeping much better with amitriptyline. She's pleased with that. She's been able to cut back on Celexa to one half of a 20 mg tablet daily. She would like to continue to taper that further. Given new prescription for Celexa 10 mg and make take that every other day for 2 weeks and then twice a week for couple of weeks and then see if she can discontinue it. I am concerned the blood pressure remains elevated. It could be that treating her blood pressure will help her headaches. Says she has frequent headaches.    Review of Systems     Objective:   Physical Exam not examined. Spent 20 minutes speaking with her about these issues      Assessment & Plan:  Frequent headaches-continue amitriptyline which should help prevent headaches. Hopefully treating blood pressure will help decrease frequency of headaches  Elevated blood pressure-likely due to benign essential hypertension  Insomnia-continue amitriptyline. It is working well.  Plan: Continue amitriptyline for insomnia. It works well. Start her on losartan 50 mg daily and follow-up in 3 weeks with possible B- met to be ordered. Takes Celexa 10 mg every other day for 2 weeks and then try to take it just twice weekly and gradually taper off completely. Follow-up in 4 weeks.

## 2016-01-26 NOTE — Patient Instructions (Signed)
Start Losartan 50 mg daily. Continue Amitriptyline for insomnia. Celexa 10 mg every other day for 4 weeks. RTC in 4 weeks.

## 2016-02-27 ENCOUNTER — Ambulatory Visit (INDEPENDENT_AMBULATORY_CARE_PROVIDER_SITE_OTHER): Payer: BC Managed Care – PPO | Admitting: Internal Medicine

## 2016-02-27 ENCOUNTER — Encounter: Payer: Self-pay | Admitting: Internal Medicine

## 2016-02-27 VITALS — BP 128/80 | HR 72 | Temp 96.9°F | Resp 16 | Wt 159.5 lb

## 2016-02-27 DIAGNOSIS — Z79899 Other long term (current) drug therapy: Secondary | ICD-10-CM

## 2016-02-27 DIAGNOSIS — I10 Essential (primary) hypertension: Secondary | ICD-10-CM

## 2016-02-27 DIAGNOSIS — R51 Headache: Secondary | ICD-10-CM | POA: Diagnosis not present

## 2016-02-27 DIAGNOSIS — R519 Headache, unspecified: Secondary | ICD-10-CM | POA: Insufficient documentation

## 2016-02-27 NOTE — Patient Instructions (Signed)
Basic metabolic panel drawn and pending. Continue same medications and return in October for six-month recheck on blood pressure and headaches. Diet and exercise encouraged.

## 2016-02-27 NOTE — Progress Notes (Signed)
   Subjective:    Patient ID: Sheryl Nguyen, female    DOB: 1961-02-26, 55 y.o.   MRN: [id number]  HPI Patient in today to follow-up on essential hypertension after having been started at last visit on losartan 50 mg daily which she is tolerating well. Basic metabolic panel drawn and pending. Headaches seem to have decreased in frequency. She is on Elavil for headaches. Has not started diet and exercise program yet. Realizes she needs to lose weight. Is tapering off of Celexa. Will probably be off Celexa in about a month.    Review of Systems as above     Objective:   Physical Exam  Chest clear to auscultation. Cardiac exam regular rate and rhythm normal S1 and S2. Extremities without edema. B- met drawn and pending as she is on losartan.      Assessment & Plan:  Essential hypertension-stable on current regimen. She'll continue to monitor blood pressure at home  Obesity-recommend diet and exercise. Follow-up in October at six-month recheck.  Headaches-doing better with Elavil  History of depression-tapering off of Celexa and will be off of that within the next month.  Hyperlipidemia-repeat lipid panel in October

## 2016-02-28 LAB — BASIC METABOLIC PANEL
BUN: 14 mg/dL (ref 7–25)
CHLORIDE: 105 mmol/L (ref 98–110)
CO2: 28 mmol/L (ref 20–31)
Calcium: 9.8 mg/dL (ref 8.6–10.4)
Creat: 0.82 mg/dL (ref 0.50–1.05)
GLUCOSE: 96 mg/dL (ref 65–99)
POTASSIUM: 4.1 mmol/L (ref 3.5–5.3)
SODIUM: 139 mmol/L (ref 135–146)

## 2016-03-29 ENCOUNTER — Other Ambulatory Visit: Payer: Self-pay | Admitting: Internal Medicine

## 2016-05-16 ENCOUNTER — Other Ambulatory Visit: Payer: Self-pay | Admitting: Internal Medicine

## 2016-06-14 ENCOUNTER — Other Ambulatory Visit (INDEPENDENT_AMBULATORY_CARE_PROVIDER_SITE_OTHER): Payer: BC Managed Care – PPO | Admitting: Internal Medicine

## 2016-06-14 DIAGNOSIS — E785 Hyperlipidemia, unspecified: Secondary | ICD-10-CM | POA: Diagnosis not present

## 2016-06-14 LAB — LIPID PANEL
Cholesterol: 236 mg/dL — ABNORMAL HIGH (ref 125–200)
HDL: 45 mg/dL — AB (ref >=46)
LDL CALC: 150 mg/dL — AB (ref <130)
Total CHOL/HDL Ratio: 5.2 Ratio — ABNORMAL HIGH (ref <=5.0)
Triglycerides: 204 mg/dL — ABNORMAL HIGH (ref <150)
VLDL: 41 mg/dL — ABNORMAL HIGH (ref <30)

## 2016-06-19 ENCOUNTER — Ambulatory Visit (INDEPENDENT_AMBULATORY_CARE_PROVIDER_SITE_OTHER): Payer: BC Managed Care – PPO | Admitting: Internal Medicine

## 2016-06-19 ENCOUNTER — Encounter: Payer: Self-pay | Admitting: Internal Medicine

## 2016-06-19 VITALS — BP 142/86 | HR 63 | Temp 97.6°F | Ht 62.0 in | Wt 158.5 lb

## 2016-06-19 DIAGNOSIS — E782 Mixed hyperlipidemia: Secondary | ICD-10-CM

## 2016-06-19 DIAGNOSIS — I10 Essential (primary) hypertension: Secondary | ICD-10-CM

## 2016-06-19 DIAGNOSIS — Z8659 Personal history of other mental and behavioral disorders: Secondary | ICD-10-CM | POA: Diagnosis not present

## 2016-06-19 DIAGNOSIS — Z23 Encounter for immunization: Secondary | ICD-10-CM

## 2016-06-19 MED ORDER — EZETIMIBE 10 MG PO TABS: 10.0000 mg | ORAL_TABLET | Freq: Every day | ORAL | 3 refills | Status: DC

## 2016-06-19 NOTE — Progress Notes (Signed)
   Subjective:    Patient ID: Jenett Mowdy, female    DOB: 05/04/1961, 55 y.o.   MRN: [id number]  HPI   55 year old Female for follow-up of hyperlipidemia. She was interested in tapering Celexa but when she began to do this, her husband said she became more irritable.  Is now on Celexa  10 mg daily. Here for BP check as well. BP elevated on arrival. Has had recent URI. Also here to follow up on lipids. No real change with trial of diet. Not exercising. So we are going to try Zetia for lipid control. She has chronic musculoskeletal pain and does not want statin medication  Review of Systems     Objective:   Physical Exam BP 130/80  She says nasally congested. TMs and pharynx are clear. Neck supple. Chest clear. Cardiac exam regular rate and rhythm. Extremities without edema     Assessment & Plan:  Acute URI-resolving. Patient does not want treatment  History of depression-tapered Celexa to 10 mg daily  Hyperlipidemia-try Zetia 10 mg daily and follow-up in 3 months  Obesity-recommend diet and exercise

## 2016-07-03 NOTE — Patient Instructions (Signed)
Continue 10 mg Celexa daily. Trial of Zetia 10 mg daily and follow-up in January. Continue to monitor blood pressure.

## 2016-09-18 ENCOUNTER — Other Ambulatory Visit: Payer: BC Managed Care – PPO | Admitting: Internal Medicine

## 2016-09-18 DIAGNOSIS — E7800 Pure hypercholesterolemia, unspecified: Secondary | ICD-10-CM

## 2016-09-18 LAB — LIPID PANEL
Cholesterol: 213 mg/dL — ABNORMAL HIGH (ref <200)
HDL: 44 mg/dL — ABNORMAL LOW (ref >50)
LDL Cholesterol: 109 mg/dL — ABNORMAL HIGH (ref <100)
Total CHOL/HDL Ratio: 4.8 Ratio (ref <5.0)
Triglycerides: 299 mg/dL — ABNORMAL HIGH (ref <150)
VLDL: 60 mg/dL — ABNORMAL HIGH (ref <30)

## 2016-09-18 NOTE — Addendum Note (Signed)
Addended by: Inocencio Homes on: 09/18/2016 11:44 AM   Modules accepted: Orders

## 2016-09-18 NOTE — Progress Notes (Signed)
Labs only

## 2016-09-20 ENCOUNTER — Ambulatory Visit: Payer: BC Managed Care – PPO | Admitting: Internal Medicine

## 2016-09-28 ENCOUNTER — Ambulatory Visit (INDEPENDENT_AMBULATORY_CARE_PROVIDER_SITE_OTHER): Payer: BC Managed Care – PPO | Admitting: Internal Medicine

## 2016-09-28 ENCOUNTER — Encounter: Payer: Self-pay | Admitting: Internal Medicine

## 2016-09-28 VITALS — BP 140/90 | HR 62 | Temp 97.7°F | Wt 157.0 lb

## 2016-09-28 DIAGNOSIS — E663 Overweight: Secondary | ICD-10-CM

## 2016-09-28 DIAGNOSIS — E782 Mixed hyperlipidemia: Secondary | ICD-10-CM | POA: Diagnosis not present

## 2016-09-28 DIAGNOSIS — I10 Essential (primary) hypertension: Secondary | ICD-10-CM

## 2016-09-28 DIAGNOSIS — R51 Headache: Secondary | ICD-10-CM | POA: Diagnosis not present

## 2016-09-28 DIAGNOSIS — R519 Headache, unspecified: Secondary | ICD-10-CM
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
## 2016-09-30 NOTE — Patient Instructions (Signed)
Please have blood pressure medicine refilled. Continue Zetia. Please seek out personal trainer for diet exercise and weight loss. Consider dietitian for consultation with weight loss. Return in 3 months.

## 2016-10-02 ENCOUNTER — Other Ambulatory Visit: Payer: Self-pay | Admitting: Internal Medicine

## 2016-10-02 DIAGNOSIS — Z1231 Encounter for screening mammogram for malignant neoplasm of breast: Secondary | ICD-10-CM

## 2016-10-04 ENCOUNTER — Ambulatory Visit
Admission: RE | Admit: 2016-10-04 | Discharge: 2016-10-04 | Disposition: A | Discharge status: Home / Self Care | Payer: BC Managed Care – PPO | Source: Ambulatory Visit | Attending: Internal Medicine | Admitting: Internal Medicine

## 2016-10-04 DIAGNOSIS — Z1231 Encounter for screening mammogram for malignant neoplasm of breast: Secondary | ICD-10-CM

## 2016-10-28 ENCOUNTER — Other Ambulatory Visit: Payer: Self-pay | Admitting: Internal Medicine

## 2016-12-06 ENCOUNTER — Other Ambulatory Visit: Payer: Self-pay | Admitting: Internal Medicine

## 2016-12-10 ENCOUNTER — Other Ambulatory Visit: Payer: Self-pay | Admitting: Internal Medicine

## 2017-01-01 ENCOUNTER — Other Ambulatory Visit: Payer: BC Managed Care – PPO | Admitting: Internal Medicine

## 2017-01-03 ENCOUNTER — Ambulatory Visit: Payer: BC Managed Care – PPO | Admitting: Internal Medicine

## 2017-01-10 ENCOUNTER — Other Ambulatory Visit: Payer: BC Managed Care – PPO | Admitting: Internal Medicine

## 2017-01-10 DIAGNOSIS — E785 Hyperlipidemia, unspecified: Secondary | ICD-10-CM

## 2017-01-10 LAB — LIPID PANEL
CHOL/HDL RATIO: 4.1 ratio (ref <5.0)
Cholesterol: 214 mg/dL — ABNORMAL HIGH (ref <200)
HDL: 52 mg/dL (ref >50)
LDL CALC: 125 mg/dL — AB (ref <100)
Triglycerides: 186 mg/dL — ABNORMAL HIGH (ref <150)
VLDL: 37 mg/dL — AB (ref <30)

## 2017-01-15 ENCOUNTER — Ambulatory Visit (INDEPENDENT_AMBULATORY_CARE_PROVIDER_SITE_OTHER): Payer: BC Managed Care – PPO | Admitting: Internal Medicine

## 2017-01-15 ENCOUNTER — Encounter: Payer: Self-pay | Admitting: Internal Medicine

## 2017-01-15 VITALS — BP 124/86 | HR 68 | Temp 98.6°F | Ht 62.0 in | Wt 164.0 lb

## 2017-01-15 DIAGNOSIS — Z8659 Personal history of other mental and behavioral disorders: Secondary | ICD-10-CM | POA: Diagnosis not present

## 2017-01-15 DIAGNOSIS — M791 Myalgia: Secondary | ICD-10-CM

## 2017-01-15 DIAGNOSIS — I10 Essential (primary) hypertension: Secondary | ICD-10-CM

## 2017-01-15 DIAGNOSIS — E782 Mixed hyperlipidemia: Secondary | ICD-10-CM | POA: Diagnosis not present

## 2017-01-15 DIAGNOSIS — M7918 Myalgia, other site: Secondary | ICD-10-CM

## 2017-01-15 MED ORDER — ROSUVASTATIN CALCIUM 5 MG PO TABS: 5.0000 mg | ORAL_TABLET | Freq: Every day | ORAL | 1 refills | Status: DC

## 2017-01-28 ENCOUNTER — Encounter: Payer: Self-pay | Admitting: Internal Medicine

## 2017-01-28 NOTE — Progress Notes (Signed)
   Subjective:    Patient ID: Sheryl Nguyen, female    DOB: 1961/03/27, 56 y.o.   MRN: [id number]  HPI 56 year old female in today to follow-up on hypertension and hyperlipidemia. She feels well. Has been going to work out with Physiological scientist and finds it quite helpful. She feels stronger and has more energy. Blood pressure is excellent at 124/86. Her triglycerides have improved from 299-186. However her LDL has gone up from 109-125. Total cholesterol is stable at 214 and was 213 4 months ago. The really improvement is in her triglycerides.  Current medications include Crestor 5 mg daily, Cozaar 50 mg daily.    Review of Systems     Objective:   Physical Exam Neck supple. Chest clear. Cardiac exam regular rate and rhythm normal S1 and S2. Skin warm and dry. Extremities without edema       Assessment & Plan:  Essential hypertension  Hyperlipidemia  Obesity  Plan: Continue working with Physiological scientist. Continue diet exercise and weight loss. Continue same medications. Follow-up July.

## 2017-01-28 NOTE — Patient Instructions (Addendum)
Continue same medications and return in July. Watch diet and continue exercise efforts.

## 2017-02-11 ENCOUNTER — Other Ambulatory Visit: Payer: Self-pay | Admitting: Internal Medicine

## 2017-03-11 ENCOUNTER — Other Ambulatory Visit: Payer: BC Managed Care – PPO | Admitting: Internal Medicine

## 2017-03-12 ENCOUNTER — Ambulatory Visit (INDEPENDENT_AMBULATORY_CARE_PROVIDER_SITE_OTHER): Payer: BC Managed Care – PPO | Admitting: Internal Medicine

## 2017-03-12 ENCOUNTER — Other Ambulatory Visit: Payer: Self-pay | Admitting: Internal Medicine

## 2017-03-12 ENCOUNTER — Other Ambulatory Visit: Payer: BC Managed Care – PPO | Admitting: Internal Medicine

## 2017-03-12 ENCOUNTER — Encounter: Payer: Self-pay | Admitting: Internal Medicine

## 2017-03-12 VITALS — BP 110/70 | HR 65 | Temp 98.3°F | Wt 164.0 lb

## 2017-03-12 DIAGNOSIS — E782 Mixed hyperlipidemia: Secondary | ICD-10-CM | POA: Diagnosis not present

## 2017-03-12 DIAGNOSIS — I10 Essential (primary) hypertension: Secondary | ICD-10-CM | POA: Diagnosis not present

## 2017-03-12 DIAGNOSIS — F39 Unspecified mood [affective] disorder: Secondary | ICD-10-CM

## 2017-03-12 MED ORDER — LOSARTAN POTASSIUM-HCTZ 100-25 MG PO TABS: 1.0000 | ORAL_TABLET | Freq: Every day | ORAL | 0 refills | Status: DC

## 2017-03-13 ENCOUNTER — Telehealth: Payer: Self-pay

## 2017-03-13 LAB — HEPATIC FUNCTION PANEL
ALT: 22 U/L (ref 6–29)
AST: 23 U/L (ref 10–35)
Albumin: 4.1 g/dL (ref 3.6–5.1)
Alkaline Phosphatase: 87 U/L (ref 33–130)
BILIRUBIN DIRECT: 0.1 mg/dL (ref <=0.2)
Indirect Bilirubin: 0.6 mg/dL (ref 0.2–1.2)
Total Bilirubin: 0.7 mg/dL (ref 0.2–1.2)
Total Protein: 6.5 g/dL (ref 6.1–8.1)

## 2017-03-13 LAB — LIPID PANEL
CHOLESTEROL: 206 mg/dL — AB (ref <200)
HDL: 44 mg/dL — AB (ref >50)
LDL Cholesterol: 102 mg/dL — ABNORMAL HIGH (ref <100)
TRIGLYCERIDES: 299 mg/dL — AB (ref <150)
Total CHOL/HDL Ratio: 4.7 Ratio (ref <5.0)
VLDL: 60 mg/dL — AB (ref <30)

## 2017-03-13 MED ORDER — ROSUVASTATIN CALCIUM 10 MG PO TABS: 10.0000 mg | ORAL_TABLET | Freq: Every day | ORAL | 0 refills | Status: DC

## 2017-03-13 NOTE — Telephone Encounter (Signed)
Sent in new dosage

## 2017-03-13 NOTE — Telephone Encounter (Signed)
-----   Message from Elby Showers, MD sent at 03/13/2017 11:29 AM EDT ----- Increase Crestor to 10 mg daily can call in new rx and she can double up on what she has. Do lipid panel and liver functions day before next visit in August.

## 2017-03-14 LAB — HEMOGLOBIN A1C
Hgb A1c MFr Bld: 5.1 % (ref <5.7)
Mean Plasma Glucose: 100 mg/dL

## 2017-03-30 NOTE — Progress Notes (Signed)
   Subjective:    Patient ID: Sheryl Nguyen, female    DOB: [birth year], 56 y.o.   MRN: [id number]  HPI She is here today to follow-up on hypertension. 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 Blood pressure is excellent today at 110/70. At last visit in May, her blood pressure was stable at 124/86. Her LDL was of and her triglycerides were up. She was on Crestor 5 mg daily. I asked that she continue to work on diet exercise and weight loss and follow-up this month. Unfortunately, cholesterol is 206 and previously was 214 in May. Triglycerides have increased from 186 to299 and LDL cholesterol oddly enough is decreased from 125 to102.  I'm going to suggest she increase Crestor to 10 mg daily and follow-up in August. She denies noncompliance with current dose of Crestor 5 mg daily.  Mood disorder stable on Celexa    Review of Systems see above   Objective:   Physical Exam Neck supple without JVD thyromegaly or carotid bruits. Chest clear to auscultation. Cardiac exam regular rate and rhythm. Extremities without edema.       Assessment & Plan:   Essential hypertension  Hyperlipidemia-mixed  Mood disorder-stable on Celexa  Plan: Crestor increased to 10 mg daily and follow-up in August. Continue losartan HCTZ 100/25 daily.

## 2017-03-30 NOTE — Patient Instructions (Signed)
Continue same antihypertensive medication. Increase Crestor from 5-10 mg daily and follow-up in August. Watch diet and continue exercising.

## 2017-04-04 ENCOUNTER — Other Ambulatory Visit: Payer: BC Managed Care – PPO | Admitting: Internal Medicine

## 2017-04-04 DIAGNOSIS — I10 Essential (primary) hypertension: Secondary | ICD-10-CM

## 2017-04-04 DIAGNOSIS — E785 Hyperlipidemia, unspecified: Secondary | ICD-10-CM

## 2017-04-04 DIAGNOSIS — E669 Obesity, unspecified: Secondary | ICD-10-CM

## 2017-04-04 LAB — BASIC METABOLIC PANEL
BUN: 24 mg/dL (ref 7–25)
CALCIUM: 9.6 mg/dL (ref 8.6–10.4)
CO2: 27 mmol/L (ref 20–31)
Chloride: 102 mmol/L (ref 98–110)
Creat: 0.96 mg/dL (ref 0.50–1.05)
Glucose, Bld: 93 mg/dL (ref 65–99)
Potassium: 3.9 mmol/L (ref 3.5–5.3)
SODIUM: 140 mmol/L (ref 135–146)

## 2017-04-04 LAB — HEPATIC FUNCTION PANEL
ALBUMIN: 4.3 g/dL (ref 3.6–5.1)
ALK PHOS: 82 U/L (ref 33–130)
ALT: 23 U/L (ref 6–29)
AST: 21 U/L (ref 10–35)
BILIRUBIN DIRECT: 0.1 mg/dL (ref <=0.2)
BILIRUBIN INDIRECT: 0.5 mg/dL (ref 0.2–1.2)
BILIRUBIN TOTAL: 0.6 mg/dL (ref 0.2–1.2)
Total Protein: 6.9 g/dL (ref 6.1–8.1)

## 2017-04-04 LAB — LIPID PANEL
CHOL/HDL RATIO: 3.8 ratio (ref <5.0)
Cholesterol: 180 mg/dL (ref <200)
HDL: 48 mg/dL — AB (ref >50)
LDL Cholesterol: 98 mg/dL (ref <100)
Triglycerides: 169 mg/dL — ABNORMAL HIGH (ref <150)
VLDL: 34 mg/dL — AB (ref <30)

## 2017-04-08 ENCOUNTER — Ambulatory Visit (INDEPENDENT_AMBULATORY_CARE_PROVIDER_SITE_OTHER): Payer: BC Managed Care – PPO | Admitting: Internal Medicine

## 2017-04-08 ENCOUNTER — Encounter: Payer: Self-pay | Admitting: Internal Medicine

## 2017-04-08 VITALS — BP 118/80 | HR 73 | Temp 98.5°F | Wt 160.0 lb

## 2017-04-08 DIAGNOSIS — Z8659 Personal history of other mental and behavioral disorders: Secondary | ICD-10-CM

## 2017-04-08 DIAGNOSIS — E782 Mixed hyperlipidemia: Secondary | ICD-10-CM | POA: Diagnosis not present

## 2017-04-08 DIAGNOSIS — M791 Myalgia: Secondary | ICD-10-CM

## 2017-04-08 DIAGNOSIS — I10 Essential (primary) hypertension: Secondary | ICD-10-CM

## 2017-04-08 DIAGNOSIS — E663 Overweight: Secondary | ICD-10-CM | POA: Diagnosis not present

## 2017-04-08 DIAGNOSIS — M7918 Myalgia, other site: Secondary | ICD-10-CM

## 2017-04-08 NOTE — Progress Notes (Signed)
   Subjective:    Patient ID: Sheryl Nguyen, female    DOB: 1961-03-22, 56 y.o.   MRN: [id number]  HPI 56 year old Female to follow-up on hypertension and hyperlipidemia today. Was here July 10 and Crestor was increased to 10 mg daily. She is on losartan HCTZ 100/25 for hypertension. Enjoying her exercise classes with [employer] [facility]. Feels better.    Review of Systems     Objective:   Physical Exam Neck supple. Chest clear. Cardiac exam regular rate and rhythm. Extremities without edema  Total cholesterol has improved from 206-180. Triglycerides have improved from 299-169. LDL cholesterol has decreased from 102-98. HDL has increased from 44-48. Liver functions are normal. Basic metabolic panel is normal.       Assessment & Plan:  Hyperlipidemia-improved on increased dose of Crestor  Essential hypertension-stable  Obesity-continue diet and exercise regimen  Musculoskeletal pain-improved with exercise  Plan: Continue same medications and return in April 2019

## 2017-04-16 ENCOUNTER — Other Ambulatory Visit: Payer: Self-pay | Admitting: Internal Medicine

## 2017-04-28 NOTE — Patient Instructions (Addendum)
Continue Crestor at 10 mg daily. Continue losartan HCTZ. May follow-up in April for physical examination.

## 2017-05-20 ENCOUNTER — Other Ambulatory Visit: Payer: Self-pay | Admitting: Internal Medicine

## 2017-08-05 ENCOUNTER — Other Ambulatory Visit: Payer: Self-pay | Admitting: Internal Medicine

## 2017-09-12 ENCOUNTER — Other Ambulatory Visit: Payer: Self-pay | Admitting: Internal Medicine

## 2017-11-26 ENCOUNTER — Other Ambulatory Visit: Payer: Self-pay | Admitting: Internal Medicine

## 2017-11-27 ENCOUNTER — Other Ambulatory Visit: Payer: Self-pay | Admitting: Internal Medicine

## 2017-11-27 DIAGNOSIS — Z139 Encounter for screening, unspecified: Secondary | ICD-10-CM

## 2017-12-06 ENCOUNTER — Other Ambulatory Visit: Payer: Self-pay

## 2017-12-06 DIAGNOSIS — Z8659 Personal history of other mental and behavioral disorders: Secondary | ICD-10-CM

## 2017-12-06 DIAGNOSIS — Z1329 Encounter for screening for other suspected endocrine disorder: Secondary | ICD-10-CM

## 2017-12-06 DIAGNOSIS — I10 Essential (primary) hypertension: Secondary | ICD-10-CM

## 2017-12-06 DIAGNOSIS — Z0000 Encounter for general adult medical examination without abnormal findings: Secondary | ICD-10-CM

## 2017-12-06 DIAGNOSIS — E782 Mixed hyperlipidemia: Secondary | ICD-10-CM

## 2017-12-06 DIAGNOSIS — Z8639 Personal history of other endocrine, nutritional and metabolic disease: Secondary | ICD-10-CM

## 2017-12-09 ENCOUNTER — Other Ambulatory Visit: Payer: Self-pay

## 2017-12-09 DIAGNOSIS — Z1329 Encounter for screening for other suspected endocrine disorder: Secondary | ICD-10-CM

## 2017-12-09 DIAGNOSIS — Z0000 Encounter for general adult medical examination without abnormal findings: Secondary | ICD-10-CM

## 2017-12-09 DIAGNOSIS — M7918 Myalgia, other site: Secondary | ICD-10-CM

## 2017-12-09 DIAGNOSIS — Z1321 Encounter for screening for nutritional disorder: Secondary | ICD-10-CM

## 2017-12-09 DIAGNOSIS — E785 Hyperlipidemia, unspecified: Secondary | ICD-10-CM

## 2017-12-09 DIAGNOSIS — F39 Unspecified mood [affective] disorder: Secondary | ICD-10-CM

## 2017-12-09 DIAGNOSIS — M2550 Pain in unspecified joint: Secondary | ICD-10-CM

## 2017-12-19 ENCOUNTER — Ambulatory Visit
Admission: RE | Admit: 2017-12-19 | Discharge: 2017-12-19 | Disposition: A | Discharge status: Home / Self Care | Payer: BC Managed Care – PPO | Source: Ambulatory Visit | Attending: Internal Medicine | Admitting: Internal Medicine

## 2017-12-19 DIAGNOSIS — Z139 Encounter for screening, unspecified: Secondary | ICD-10-CM

## 2017-12-26 ENCOUNTER — Other Ambulatory Visit: Payer: BC Managed Care – PPO | Admitting: Internal Medicine

## 2017-12-31 ENCOUNTER — Encounter: Payer: BC Managed Care – PPO | Admitting: Internal Medicine

## 2018-01-09 ENCOUNTER — Other Ambulatory Visit: Payer: Self-pay | Admitting: Internal Medicine

## 2018-01-09 DIAGNOSIS — Z0000 Encounter for general adult medical examination without abnormal findings: Secondary | ICD-10-CM

## 2018-01-16 ENCOUNTER — Other Ambulatory Visit: Payer: BC Managed Care – PPO | Admitting: Internal Medicine

## 2018-01-20 ENCOUNTER — Encounter: Payer: BC Managed Care – PPO | Admitting: Internal Medicine

## 2018-01-23 ENCOUNTER — Other Ambulatory Visit: Payer: BC Managed Care – PPO | Admitting: Internal Medicine

## 2018-01-23 DIAGNOSIS — M2550 Pain in unspecified joint: Secondary | ICD-10-CM

## 2018-01-23 DIAGNOSIS — Z0000 Encounter for general adult medical examination without abnormal findings: Secondary | ICD-10-CM

## 2018-01-23 DIAGNOSIS — Z1321 Encounter for screening for nutritional disorder: Secondary | ICD-10-CM

## 2018-01-23 DIAGNOSIS — F39 Unspecified mood [affective] disorder: Secondary | ICD-10-CM

## 2018-01-23 DIAGNOSIS — Z1329 Encounter for screening for other suspected endocrine disorder: Secondary | ICD-10-CM

## 2018-01-23 DIAGNOSIS — E785 Hyperlipidemia, unspecified: Secondary | ICD-10-CM

## 2018-01-23 DIAGNOSIS — M7918 Myalgia, other site: Secondary | ICD-10-CM

## 2018-01-24 LAB — COMPLETE METABOLIC PANEL WITH GFR
AG RATIO: 1.8 (calc) (ref 1.0–2.5)
ALBUMIN MSPROF: 4.4 g/dL (ref 3.6–5.1)
ALT: 26 U/L (ref 6–29)
AST: 22 U/L (ref 10–35)
Alkaline phosphatase (APISO): 79 U/L (ref 33–130)
BUN: 16 mg/dL (ref 7–25)
CALCIUM: 9.7 mg/dL (ref 8.6–10.4)
CO2: 26 mmol/L (ref 20–32)
Chloride: 105 mmol/L (ref 98–110)
Creat: 0.85 mg/dL (ref 0.50–1.05)
GFR, EST AFRICAN AMERICAN: 87 mL/min/{1.73_m2} (ref >=60)
GFR, EST NON AFRICAN AMERICAN: 75 mL/min/{1.73_m2} (ref >=60)
GLOBULIN: 2.4 g/dL (ref 1.9–3.7)
Glucose, Bld: 93 mg/dL (ref 65–99)
POTASSIUM: 4.1 mmol/L (ref 3.5–5.3)
SODIUM: 139 mmol/L (ref 135–146)
Total Bilirubin: 0.5 mg/dL (ref 0.2–1.2)
Total Protein: 6.8 g/dL (ref 6.1–8.1)

## 2018-01-24 LAB — CBC WITH DIFFERENTIAL/PLATELET
BASOS ABS: 59 {cells}/uL (ref 0–200)
Basophils Relative: 1.1 %
EOS ABS: 232 {cells}/uL (ref 15–500)
Eosinophils Relative: 4.3 %
HCT: 37.4 % (ref 35.0–45.0)
HEMOGLOBIN: 12.6 g/dL (ref 11.7–15.5)
Lymphs Abs: 1944 cells/uL (ref 850–3900)
MCH: 29.9 pg (ref 27.0–33.0)
MCHC: 33.7 g/dL (ref 32.0–36.0)
MCV: 88.8 fL (ref 80.0–100.0)
MONOS PCT: 8.5 %
MPV: 10.5 fL (ref 7.5–12.5)
NEUTROS ABS: 2705 {cells}/uL (ref 1500–7800)
NEUTROS PCT: 50.1 %
Platelets: 231 10*3/uL (ref 140–400)
RBC: 4.21 10*6/uL (ref 3.80–5.10)
RDW: 12.1 % (ref 11.0–15.0)
Total Lymphocyte: 36 %
WBC mixed population: 459 cells/uL (ref 200–950)
WBC: 5.4 10*3/uL (ref 3.8–10.8)

## 2018-01-24 LAB — LIPID PANEL
CHOLESTEROL: 176 mg/dL (ref <200)
HDL: 55 mg/dL (ref >50)
LDL CHOLESTEROL (CALC): 97 mg/dL
Non-HDL Cholesterol (Calc): 121 mg/dL (calc) (ref <130)
TRIGLYCERIDES: 143 mg/dL (ref <150)
Total CHOL/HDL Ratio: 3.2 (calc) (ref <5.0)

## 2018-01-24 LAB — TSH: TSH: 1.1 mIU/L (ref 0.40–4.50)

## 2018-01-24 LAB — VITAMIN D 25 HYDROXY (VIT D DEFICIENCY, FRACTURES): VIT D 25 HYDROXY: 43 ng/mL (ref 30–100)

## 2018-01-28 ENCOUNTER — Encounter: Payer: Self-pay | Admitting: Internal Medicine

## 2018-01-28 ENCOUNTER — Ambulatory Visit (INDEPENDENT_AMBULATORY_CARE_PROVIDER_SITE_OTHER): Payer: BC Managed Care – PPO | Admitting: Internal Medicine

## 2018-01-28 VITALS — BP 130/72 | HR 73 | Ht 62.0 in | Wt 157.0 lb

## 2018-01-28 DIAGNOSIS — Z6828 Body mass index (BMI) 28.0-28.9, adult: Secondary | ICD-10-CM

## 2018-01-28 DIAGNOSIS — I10 Essential (primary) hypertension: Secondary | ICD-10-CM | POA: Diagnosis not present

## 2018-01-28 DIAGNOSIS — Z8659 Personal history of other mental and behavioral disorders: Secondary | ICD-10-CM

## 2018-01-28 DIAGNOSIS — Z0000 Encounter for general adult medical examination without abnormal findings: Secondary | ICD-10-CM

## 2018-01-28 DIAGNOSIS — E782 Mixed hyperlipidemia: Secondary | ICD-10-CM | POA: Diagnosis not present

## 2018-01-28 DIAGNOSIS — J3089 Other allergic rhinitis: Secondary | ICD-10-CM | POA: Diagnosis not present

## 2018-01-28 MED ORDER — CITALOPRAM HYDROBROMIDE 40 MG PO TABS: 40.0000 mg | ORAL_TABLET | Freq: Every day | ORAL | 3 refills | Status: AC

## 2018-01-28 MED ORDER — ROSUVASTATIN CALCIUM 10 MG PO TABS: ORAL_TABLET | ORAL | 1 refills | Status: DC

## 2018-01-28 MED ORDER — LOSARTAN POTASSIUM-HCTZ 100-25 MG PO TABS: 1.0000 | ORAL_TABLET | Freq: Every day | ORAL | 1 refills | Status: DC

## 2018-01-28 MED ORDER — CETIRIZINE HCL 10 MG PO TABS: 10.0000 mg | ORAL_TABLET | Freq: Every day | ORAL | 3 refills | Status: AC

## 2018-01-28 NOTE — Progress Notes (Signed)
Subjective:    Patient ID: Sheryl Nguyen, female    DOB: 09/02/1961, 57 y.o.   MRN: [id number]  HPI 57 year old Female for health maintenance exam and evaluation of medical issues.  She and her husband are moving to [place] in June as he has accepted a  position there at a college.  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  Patient has a history of hypertension, back pain, celiac disease, history of vitamin D deficiency.  No known drug allergies.  Has had some sleep issues over the years.  We have tried amitriptyline.  She tried Celexa.  It helped her with mood and and keeping her temper with her adopted daughter.  Social history: She moved here from [place] with her husband in 2010.  Husband is Binnie Kand who until recently was held of the art department and [employer] and was Therapist, sports in the department of visual and performing arts.  He is excepted a job in [place].  Patient is a native of [place].  She is an Forensic psychologist.  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  She does not smoke.  One glass of wine daily.  They adopted a daughter from [place] who is now in college in [place] and is doing well.  Family history: History of kidney stones in her father as well as hypertension and MI.  Mother with history of "nervous breakdown ", headaches, arthritis, allergies.  Patient says mother has memory issues.  Past medical history: Patient had a kidney stone sometime around 2008 but we do not have those records.  History of hepatitis A in 1985.  Laparoscopic surgery for endometriosis in 1997.  She has never been pregnant.  History of mood disorder and depression treated with Celexa.  She has had some issues with postmenopausal bleeding and endometrial biopsy was done by Dr. Edwinna Areola revealing weakly positive endometrium.  IUD was removed.  She needs to see Dr. Sabra Heck again in the near future.  In September 2011 she was found to have positive tissue transglutaminase IgA antibodies  consistent with celiac disease.  It was recommended she follow a gluten-free diet.  History of hyperlipidemia treated with Crestor.  History of back pain for which Flexeril has been prescribed on an as-needed basis.  She is been exercising with Buren Kos and that seems to have helped her mood and also her physical conditioning.  She had colonoscopy at [facility] in [facility] in August 2012.  Records indicate she had a tubular adenoma.  She had repeat colonoscopy with Dr. Carlean Purl in 2016 that was normal with 10-year follow-up recommended.  Dr. Patria Mane also tested her for gluten sensitivity and did not find a positive test.  However we found significantly elevated tissue transglutaminase IgA antibodies in 2011.    Review of Systems  Constitutional: Negative.   HENT:       History of allergic rhinitis  Respiratory: Negative.   Cardiovascular: Negative.   Musculoskeletal:       History of back pain  Neurological: Negative.   Psychiatric/Behavioral: Negative.        Objective:   Physical Exam  Constitutional: She is oriented to person, place, and time. She appears well-developed and well-nourished. No distress.  HENT:  Head: Normocephalic and atraumatic.  Right Ear: External ear normal.  Left Ear: External ear normal.  Mouth/Throat: Oropharynx is clear and moist.  Eyes: Conjunctivae and EOM are normal. Right eye exhibits no discharge. Left eye  exhibits no discharge. No scleral icterus.  Neck: Neck supple. No JVD present. No thyromegaly present.  Cardiovascular: Normal rate, regular rhythm, normal heart sounds and intact distal pulses. Exam reveals no friction rub.  No murmur heard. Pulmonary/Chest: Effort normal and breath sounds normal. No stridor. No respiratory distress. She has no wheezes. She has no rales.  Abdominal: She exhibits no distension and no mass. There is no tenderness. There is no rebound and no guarding. No hernia.  Genitourinary:  Genitourinary  Comments: Deferred to GYN  Lymphadenopathy:    She has no cervical adenopathy.  Neurological: She is alert and oriented to person, place, and time.  Skin: Skin is warm and dry. She is not diaphoretic.  Psychiatric: She has a normal mood and affect. Her behavior is normal. Judgment and thought content normal.          Assessment & Plan:  Essential hypertension-stable on current regimen of losartan HCTZ  Hyperlipidemia-lipid panel within normal limits on Crestor  History of mood disorder and depression-improved-continue Celexa  History of insomnia-currently not on any sleep medication  History of vitamin D deficiency-level is 43 at the present time on over-the-counter supplement  History of musculoskeletal pain  History of allergic rhinitis-treated with Zyrtec and Flonase  BMI 28.72  Plan: We have refilled all of her current medications for 6 months which I think can easily be  transferred when she moves to [place].  I am pleased with her lab results today.  Encouraged her to continue with diet exercise and weight loss

## 2018-01-28 NOTE — Patient Instructions (Addendum)
It is a pleasure to see you today and it has been a pleasure taking care of you.  We will be happy to transfer records to position of your choice  after your move to [place].  Medications refilled today for 6 months.  Shingrix vaccine suggested.

## 2018-02-03 ENCOUNTER — Other Ambulatory Visit (INDEPENDENT_AMBULATORY_CARE_PROVIDER_SITE_OTHER): Payer: BC Managed Care – PPO | Admitting: Internal Medicine

## 2018-02-03 DIAGNOSIS — Z0000 Encounter for general adult medical examination without abnormal findings: Secondary | ICD-10-CM | POA: Diagnosis not present

## 2018-02-03 LAB — POCT URINALYSIS DIPSTICK
Appearance: NORMAL
BILIRUBIN UA: NEGATIVE
Blood, UA: NEGATIVE
GLUCOSE UA: NEGATIVE
Ketones, UA: NEGATIVE
Leukocytes, UA: NEGATIVE
Nitrite, UA: NEGATIVE
ODOR: NORMAL
Protein, UA: NEGATIVE
Spec Grav, UA: 1.015 (ref 1.010–1.025)
Urobilinogen, UA: 0.2 E.U./dL
pH, UA: 6.5 (ref 5.0–8.0)

## 2018-02-18 ENCOUNTER — Encounter: Payer: Self-pay | Admitting: Obstetrics & Gynecology

## 2018-02-18 ENCOUNTER — Ambulatory Visit: Payer: BC Managed Care – PPO | Admitting: Obstetrics & Gynecology

## 2018-02-18 ENCOUNTER — Other Ambulatory Visit: Payer: Self-pay

## 2018-02-18 ENCOUNTER — Other Ambulatory Visit (HOSPITAL_COMMUNITY)
Admission: RE | Admit: 2018-02-18 | Discharge: 2018-02-18 | Disposition: A | Discharge status: Home / Self Care | Payer: BC Managed Care – PPO | Source: Ambulatory Visit | Attending: Obstetrics & Gynecology | Admitting: Obstetrics & Gynecology

## 2018-02-18 VITALS — BP 116/80 | HR 68 | Resp 14 | Ht 62.0 in | Wt 158.2 lb

## 2018-02-18 DIAGNOSIS — Z124 Encounter for screening for malignant neoplasm of cervix: Secondary | ICD-10-CM | POA: Insufficient documentation

## 2018-02-18 DIAGNOSIS — Z205 Contact with and (suspected) exposure to viral hepatitis: Secondary | ICD-10-CM

## 2018-02-18 DIAGNOSIS — Z01419 Encounter for gynecological examination (general) (routine) without abnormal findings: Secondary | ICD-10-CM

## 2018-02-18 NOTE — Progress Notes (Signed)
57 y.o. G0P0000 MarriedCaucasianF here for annual exam.  Denies vaginal bleeding.  Moving to [place] next weekend.  Will be one hour from [place].    Patient's last menstrual period was 07/15/2015.          Sexually active: Yes.    The current method of family planning is post menopausal status.    Exercising: Yes.    stretching, walking Smoker:  no  Health Maintenance: Pap:  07/25/15 Neg. HR HPV:neg  12/08/12 neg  History of abnormal Pap:  yes MMG:  12/19/17 BIRADS1:Neg  Colonoscopy:  01/03/15 Normal. F/u 10 years  BMD:   03/24/15  TDaP:  2014 Pneumonia vaccine(s):  No Shingrix:  zostavax done  Hep C testing: no Screening Labs: PCP   reports that she has never smoked. She has never used smokeless tobacco. She reports that she drinks about 4.2 oz of alcohol per week. She reports that she does not use drugs.  Past Medical History:  Diagnosis Date  . Abnormal Pap smear of cervix    yrs ago  . Allergy    allergic rhinitis  . Anxiety   . Back pain   . Celiac disease   . Cluster headache   . Depression   . Endometriosis   . Fibroid   . Hepatitis A   . Hx of adenomatous polyp of colon 01/01/2011  . Hyperlipidemia   . Infertility, female   . Kidney stone   . Mood disorder ([place])   . Urinary incontinence   . Vitamin D deficiency     Past Surgical History:  Procedure Laterality Date  . COLONOSCOPY     2012 TA, 2014 all normal   . DILATION AND CURETTAGE OF UTERUS    . EXPLORATORY LAPAROTOMY     endometriosis  . mirena insertion     and removal    Current Outpatient Medications  Medication Sig Dispense Refill  . CALCIUM PO Take 1 tablet by mouth daily.    . cetirizine (ZYRTEC) 10 MG tablet Take 1 tablet (10 mg total) by mouth daily. 90 tablet 3  . Cholecalciferol (VITAMIN D3) 2000 UNITS capsule Take 2,000 Units by mouth daily.    . citalopram (CELEXA) 40 MG tablet Take 1 tablet (40 mg total) by mouth daily. 90 tablet 3  . fluticasone (FLONASE) 50 MCG/ACT nasal spray  INSTILL 2 SPRAYS IN EACH NOSTRIL EVERY DAY 16 g 11  . losartan-hydrochlorothiazide (HYZAAR) 100-25 MG tablet Take 1 tablet by mouth daily. 90 tablet 1  . pseudoephedrine (SUDAFED) 30 MG/5ML syrup Take by mouth every 8 (eight) hours as needed for congestion.    . rosuvastatin (CRESTOR) 10 MG tablet TAKE 1 TABLET(10 MG) BY MOUTH DAILY 90 tablet 1   No current facility-administered medications for this visit.     Family History  Problem Relation Age of Onset  . Skin cancer Mother   . Thyroid disease Mother   . Stroke Mother   . Aneurysm Mother        AAA  . Hypertension Mother   . Skin cancer Father   . Hypertension Father   . Stroke Father   . Heart attack Father   . Stroke Maternal Grandmother   . Diabetes Maternal Grandfather   . Hypertension Paternal Grandfather   . Colon cancer Neg Hx   . Rectal cancer Neg Hx   . Stomach cancer Neg Hx     Review of Systems  HENT: Positive for congestion.   All other systems reviewed and  are negative.   Exam:   BP 116/80 (BP Location: Right Arm, Patient Position: Sitting, Cuff Size: Normal)   Pulse 68   Resp 14   Ht 5\' 2"  (1.575 m)   Wt 158 lb 3.2 oz (71.8 kg)   LMP 07/15/2015   BMI 28.94 kg/m     Height: 5\' 2"  (157.5 cm)  Ht Readings from Last 3 Encounters:  02/18/18 5\' 2"  (1.575 m)  01/28/18 5\' 2"  (1.575 m)  01/15/17 5\' 2"  (1.575 m)    General appearance: alert, cooperative and appears stated age Head: Normocephalic, without obvious abnormality, atraumatic Neck: no adenopathy, supple, symmetrical, trachea midline and thyroid normal to inspection and palpation Lungs: clear to auscultation bilaterally Breasts: normal appearance, no masses or tenderness Heart: regular rate and rhythm Abdomen: soft, non-tender; bowel sounds normal; no masses,  no organomegaly Extremities: extremities normal, atraumatic, no cyanosis or edema Skin: Skin color, texture, turgor normal. No rashes or lesions Lymph nodes: Cervical, supraclavicular,  and axillary nodes normal. No abnormal inguinal nodes palpated Neurologic: Grossly normal   Pelvic: External genitalia:  no lesions              Urethra:  normal appearing urethra with no masses, tenderness or lesions              Bartholins and Skenes: normal                 Vagina: normal appearing vagina with normal color and discharge, no lesions              Cervix: no lesions              Pap taken: Yes.   Bimanual Exam:  Uterus:  normal size, contour, position, consistency, mobility, non-tender              Adnexa: normal adnexa and no mass, fullness, tenderness               Rectovaginal: Confirms               Anus:  normal sphincter tone, no lesions  Chaperone was present for exam.  A:  Well Woman with normal exam PMP, no HRT Elevated lipids H/O kidney stones Vit D deficiency   P:   Mammogram guidelines reviewed pap smear and HR HPV obtained today Hep C antibody obtained today D/w pt shingrix vaccine Return annually or prn

## 2018-02-19 LAB — CYTOLOGY - PAP
ADEQUACY: ABSENT
Diagnosis: NEGATIVE
HPV: NOT DETECTED

## 2018-02-19 LAB — HEPATITIS C ANTIBODY: Hep C Virus Ab: <0.1 s/co ratio (ref 0.0–0.9)

## 2018-10-05 ENCOUNTER — Other Ambulatory Visit: Payer: Self-pay | Admitting: Internal Medicine

## 2018-11-03 ENCOUNTER — Other Ambulatory Visit: Payer: Self-pay | Admitting: Internal Medicine

## 2018-11-25 ENCOUNTER — Telehealth: Payer: Self-pay | Admitting: Internal Medicine

## 2018-11-25 NOTE — Telephone Encounter (Signed)
Faxed Medical Records (48 pages) to [facility], patient moved out of [place].

## 2019-05-04 ENCOUNTER — Other Ambulatory Visit: Payer: Self-pay

## 2019-05-04 NOTE — Telephone Encounter (Signed)
Left detailed message.   

## 2019-05-04 NOTE — Telephone Encounter (Signed)
Please call her back. It has been a year since she moved. She was supposed to find another provider. We cannot continue to refill meds out of state with no visit.

## 2019-05-04 NOTE — Telephone Encounter (Signed)
Patient is requesting a refill

## 2019-10-16 ENCOUNTER — Other Ambulatory Visit: Payer: Self-pay | Admitting: Internal Medicine
